# Patient Record
Sex: Female | Born: 1985 | Hispanic: No | Marital: Single | Smoking: Former smoker
Health system: Southern US, Community
[De-identification: ages and names within clinical notes are randomized; demographics above are authoritative.]

## PROBLEM LIST (undated history)

## (undated) DIAGNOSIS — O139 Gestational [pregnancy-induced] hypertension without significant proteinuria, unspecified trimester: Secondary | ICD-10-CM

## (undated) DIAGNOSIS — F32A Depression, unspecified: Secondary | ICD-10-CM

## (undated) DIAGNOSIS — F419 Anxiety disorder, unspecified: Secondary | ICD-10-CM

## (undated) DIAGNOSIS — E282 Polycystic ovarian syndrome: Secondary | ICD-10-CM

## (undated) HISTORY — DX: Polycystic ovarian syndrome: E28.2

## (undated) HISTORY — PX: TONSILLECTOMY AND ADENOIDECTOMY: SHX28

## (undated) HISTORY — PX: TONSILLECTOMY: SUR1361

## (undated) HISTORY — PX: DILATION AND CURETTAGE OF UTERUS: SHX78

## (undated) HISTORY — DX: Anxiety disorder, unspecified: F41.9

## (undated) HISTORY — DX: Depression, unspecified: F32.A

## (undated) HISTORY — DX: Gestational (pregnancy-induced) hypertension without significant proteinuria, unspecified trimester: O13.9

---

## 2019-04-12 LAB — OB RESULTS CONSOLE RPR: RPR: NONREACTIVE

## 2019-12-03 LAB — OB RESULTS CONSOLE HIV ANTIBODY (ROUTINE TESTING): HIV: NONREACTIVE

## 2019-12-03 LAB — OB RESULTS CONSOLE GC/CHLAMYDIA
Chlamydia: NEGATIVE
Gonorrhea: NEGATIVE

## 2019-12-03 LAB — OB RESULTS CONSOLE RUBELLA ANTIBODY, IGM: Rubella: IMMUNE

## 2019-12-03 LAB — OB RESULTS CONSOLE HEPATITIS B SURFACE ANTIGEN: Hepatitis B Surface Ag: NEGATIVE

## 2020-01-06 ENCOUNTER — Other Ambulatory Visit: Payer: Self-pay | Admitting: Obstetrics & Gynecology

## 2020-01-06 DIAGNOSIS — O99212 Obesity complicating pregnancy, second trimester: Secondary | ICD-10-CM

## 2020-01-08 LAB — OB RESULTS CONSOLE GC/CHLAMYDIA
Chlamydia: NEGATIVE
Gonorrhea: NEGATIVE

## 2020-01-08 LAB — OB RESULTS CONSOLE RPR: RPR: NONREACTIVE

## 2020-01-08 LAB — OB RESULTS CONSOLE HEPATITIS B SURFACE ANTIGEN: Hepatitis B Surface Ag: NEGATIVE

## 2020-01-08 LAB — OB RESULTS CONSOLE HIV ANTIBODY (ROUTINE TESTING): HIV: NONREACTIVE

## 2020-01-08 LAB — OB RESULTS CONSOLE RUBELLA ANTIBODY, IGM: Rubella: IMMUNE

## 2020-01-12 LAB — HEPATITIS C ANTIBODY
HCV Ab: NEGATIVE
HCV Ab: NEGATIVE

## 2020-02-09 ENCOUNTER — Ambulatory Visit: Payer: Self-pay

## 2020-02-11 ENCOUNTER — Other Ambulatory Visit: Payer: Self-pay

## 2020-02-11 ENCOUNTER — Encounter: Payer: Self-pay | Admitting: *Deleted

## 2020-02-11 ENCOUNTER — Ambulatory Visit (HOSPITAL_BASED_OUTPATIENT_CLINIC_OR_DEPARTMENT_OTHER): Payer: Medicaid Other

## 2020-02-11 ENCOUNTER — Ambulatory Visit: Payer: Medicaid Other | Attending: Obstetrics & Gynecology | Admitting: *Deleted

## 2020-02-11 ENCOUNTER — Other Ambulatory Visit: Payer: Self-pay | Admitting: *Deleted

## 2020-02-11 VITALS — BP 129/75 | HR 91 | Ht 64.5 in

## 2020-02-11 DIAGNOSIS — Z3A20 20 weeks gestation of pregnancy: Secondary | ICD-10-CM | POA: Insufficient documentation

## 2020-02-11 DIAGNOSIS — Z6841 Body Mass Index (BMI) 40.0 and over, adult: Secondary | ICD-10-CM

## 2020-02-11 DIAGNOSIS — Z363 Encounter for antenatal screening for malformations: Secondary | ICD-10-CM | POA: Diagnosis not present

## 2020-02-11 DIAGNOSIS — E669 Obesity, unspecified: Secondary | ICD-10-CM | POA: Diagnosis not present

## 2020-02-11 DIAGNOSIS — O99212 Obesity complicating pregnancy, second trimester: Secondary | ICD-10-CM

## 2020-03-24 ENCOUNTER — Ambulatory Visit: Payer: Medicaid Other | Attending: Obstetrics and Gynecology

## 2020-03-24 ENCOUNTER — Ambulatory Visit: Payer: Medicaid Other | Admitting: *Deleted

## 2020-03-24 ENCOUNTER — Encounter: Payer: Self-pay | Admitting: *Deleted

## 2020-03-24 ENCOUNTER — Other Ambulatory Visit: Payer: Self-pay | Admitting: *Deleted

## 2020-03-24 ENCOUNTER — Other Ambulatory Visit: Payer: Self-pay

## 2020-03-24 DIAGNOSIS — O99212 Obesity complicating pregnancy, second trimester: Secondary | ICD-10-CM | POA: Insufficient documentation

## 2020-03-24 DIAGNOSIS — Z6841 Body Mass Index (BMI) 40.0 and over, adult: Secondary | ICD-10-CM

## 2020-03-24 DIAGNOSIS — Z362 Encounter for other antenatal screening follow-up: Secondary | ICD-10-CM

## 2020-03-24 DIAGNOSIS — E669 Obesity, unspecified: Secondary | ICD-10-CM | POA: Diagnosis not present

## 2020-03-24 DIAGNOSIS — Z3A26 26 weeks gestation of pregnancy: Secondary | ICD-10-CM | POA: Insufficient documentation

## 2020-04-06 ENCOUNTER — Inpatient Hospital Stay (HOSPITAL_COMMUNITY)
Admission: AD | Admit: 2020-04-06 | Discharge: 2020-04-06 | Disposition: A | Discharge status: Home / Self Care | Payer: Medicaid Other | Attending: Obstetrics and Gynecology | Admitting: Obstetrics and Gynecology

## 2020-04-06 ENCOUNTER — Encounter (HOSPITAL_COMMUNITY): Payer: Self-pay | Admitting: Obstetrics and Gynecology

## 2020-04-06 DIAGNOSIS — F17210 Nicotine dependence, cigarettes, uncomplicated: Secondary | ICD-10-CM | POA: Diagnosis not present

## 2020-04-06 DIAGNOSIS — O163 Unspecified maternal hypertension, third trimester: Secondary | ICD-10-CM | POA: Diagnosis not present

## 2020-04-06 DIAGNOSIS — O99333 Smoking (tobacco) complicating pregnancy, third trimester: Secondary | ICD-10-CM | POA: Insufficient documentation

## 2020-04-06 DIAGNOSIS — Z7982 Long term (current) use of aspirin: Secondary | ICD-10-CM | POA: Insufficient documentation

## 2020-04-06 DIAGNOSIS — O10913 Unspecified pre-existing hypertension complicating pregnancy, third trimester: Secondary | ICD-10-CM | POA: Insufficient documentation

## 2020-04-06 DIAGNOSIS — Z3A28 28 weeks gestation of pregnancy: Secondary | ICD-10-CM | POA: Diagnosis not present

## 2020-04-06 DIAGNOSIS — O368130 Decreased fetal movements, third trimester, not applicable or unspecified: Secondary | ICD-10-CM

## 2020-04-06 LAB — CBC
HCT: 37.8 % (ref 36.0–46.0)
Hemoglobin: 12.2 g/dL (ref 12.0–15.0)
MCH: 26.8 pg (ref 26.0–34.0)
MCHC: 32.3 g/dL (ref 30.0–36.0)
MCV: 83.1 fL (ref 80.0–100.0)
Platelets: 449 10*3/uL — ABNORMAL HIGH (ref 150–400)
RBC: 4.55 MIL/uL (ref 3.87–5.11)
RDW: 14.2 % (ref 11.5–15.5)
WBC: 15.5 10*3/uL — ABNORMAL HIGH (ref 4.0–10.5)
nRBC: 0 % (ref 0.0–0.2)

## 2020-04-06 LAB — PROTEIN / CREATININE RATIO, URINE
Creatinine, Urine: 125.04 mg/dL
Protein Creatinine Ratio: 0.1 mg/mg{Cre} (ref 0.00–0.15)
Total Protein, Urine: 13 mg/dL

## 2020-04-06 LAB — URINALYSIS, ROUTINE W REFLEX MICROSCOPIC
Bilirubin Urine: NEGATIVE
Glucose, UA: NEGATIVE mg/dL
Hgb urine dipstick: NEGATIVE
Ketones, ur: NEGATIVE mg/dL
Leukocytes,Ua: NEGATIVE
Nitrite: NEGATIVE
Protein, ur: NEGATIVE mg/dL
Specific Gravity, Urine: 1.013 (ref 1.005–1.030)
pH: 6 (ref 5.0–8.0)

## 2020-04-06 LAB — COMPREHENSIVE METABOLIC PANEL
ALT: 12 U/L (ref 0–44)
AST: 12 U/L — ABNORMAL LOW (ref 15–41)
Albumin: 2.6 g/dL — ABNORMAL LOW (ref 3.5–5.0)
Alkaline Phosphatase: 62 U/L (ref 38–126)
Anion gap: 6 (ref 5–15)
BUN: 6 mg/dL (ref 6–20)
CO2: 22 mmol/L (ref 22–32)
Calcium: 8.5 mg/dL — ABNORMAL LOW (ref 8.9–10.3)
Chloride: 103 mmol/L (ref 98–111)
Creatinine, Ser: 0.65 mg/dL (ref 0.44–1.00)
GFR, Estimated: >60 mL/min (ref >60)
Glucose, Bld: 113 mg/dL — ABNORMAL HIGH (ref 70–99)
Potassium: 3.5 mmol/L (ref 3.5–5.1)
Sodium: 131 mmol/L — ABNORMAL LOW (ref 135–145)
Total Bilirubin: <0.1 mg/dL — ABNORMAL LOW (ref 0.3–1.2)
Total Protein: 5.9 g/dL — ABNORMAL LOW (ref 6.5–8.1)

## 2020-04-06 NOTE — MAU Provider Note (Signed)
Chief Complaint:  Decreased Fetal Movement   Event Date/Time   First Provider Initiated Contact with Patient 04/06/20 2128     HPI: Reta Norgren is a 35 y.o. G3P0020 at 43w0dwho presents to maternity admissions reporting Decreased fetal movement all day.  We noted her BP to be elevated. Patient states has had some elevated BPs in office and has intermittent visual changes. None today. She reports good fetal movement, denies LOF, vaginal bleeding, vaginal itching/burning, urinary symptoms, h/a, dizziness, n/v, diarrhea, constipation or fever/chills.  She denies headache, visual changes or RUQ abdominal pain.  Other This is a new problem. The current episode started today. The problem occurs constantly. The problem has been resolved. Pertinent negatives include no abdominal pain, chest pain, chills, congestion, fatigue, fever, headaches, myalgias, nausea or visual change. Nothing aggravates the symptoms. She has tried nothing for the symptoms.    RN Note: Pecola Leisure has not been moving like usual today. Have felt some FM for past hour but not like usual. Denies VB. Clear discharge. No pain currently   Past Medical History: Past Medical History:  Diagnosis Date  . Anxiety   . Depression   . PCOS (polycystic ovarian syndrome)     Past obstetric history: OB History  Gravida Para Term Preterm AB Living  3       2    SAB IAB Ectopic Multiple Live Births  1   1        # Outcome Date GA Lbr Len/2nd Weight Sex Delivery Anes PTL Lv  3 Current           2 Ectopic           1 SAB             Past Surgical History: Past Surgical History:  Procedure Laterality Date  . DILATION AND CURETTAGE OF UTERUS    . TONSILLECTOMY      Family History: Family History  Problem Relation Age of Onset  . Hypertension Mother   . Obesity Mother   . Varicose Veins Mother   . Diabetes Father   . Hypertension Father   . Asthma Sister   . Anxiety disorder Sister   . Depression Sister   . Obesity Sister    . Drug abuse Brother   . Cancer Maternal Grandmother   . Diabetes Paternal Grandmother     Social History: Social History   Tobacco Use  . Smoking status: Current Every Day Smoker    Packs/day: 0.25    Types: Cigarettes  . Smokeless tobacco: Never Used  Vaping Use  . Vaping Use: Never used  Substance Use Topics  . Alcohol use: Never  . Drug use: Yes    Types: Cocaine    Allergies: No Known Allergies  Meds:  Medications Prior to Admission  Medication Sig Dispense Refill Last Dose  . aspirin 81 MG chewable tablet Chew by mouth daily.   04/06/2020 at Unknown time  . Prenatal Vit-Fe Fumarate-FA (PRENATAL MULTIVITAMIN) TABS tablet Take 1 tablet by mouth daily at 12 noon.   04/06/2020 at Unknown time    I have reviewed patient's Past Medical Hx, Surgical Hx, Family Hx, Social Hx, medications and allergies.   ROS:  Review of Systems  Constitutional: Negative for chills, fatigue and fever.  HENT: Negative for congestion.   Cardiovascular: Negative for chest pain.  Gastrointestinal: Negative for abdominal pain and nausea.  Musculoskeletal: Negative for myalgias.  Neurological: Negative for headaches.   Other systems negative  Physical  Exam   Patient Vitals for the past 24 hrs:  BP Temp Pulse Resp SpO2 Height Weight  04/06/20 2115 136/85 -- (!) 109 -- 99 % -- --  04/06/20 2110 -- -- -- -- 100 % -- --  04/06/20 2101 (!) 145/86 -- (!) 121 -- -- -- --  04/06/20 2046 (!) 142/95 -- (!) 107 -- -- -- --  04/06/20 2038 -- 98.1 F (36.7 C) -- 18 -- 5' 5.5" (1.664 m) 124.3 kg   Constitutional: Well-developed, well-nourished female in no acute distress.  Cardiovascular: normal rate and rhythm Respiratory: normal effort, clear to auscultation bilaterally GI: Abd soft, non-tender, gravid appropriate for gestational age.   No rebound or guarding. MS: Extremities nontender, no edema, normal ROM Neurologic: Alert and oriented x 4.  GU: Neg CVAT.  PELVIC EXAM: deferred  FHT:   Baseline 145 , moderate variability, small accelerations present, no decelerations Contractions: none   Labs: Results for orders placed or performed during the hospital encounter of 04/06/20 (from the past 24 hour(s))  Urinalysis, Routine w reflex microscopic Urine, Clean Catch     Status: Abnormal   Collection Time: 04/06/20  8:50 PM  Result Value Ref Range   Color, Urine YELLOW YELLOW   APPearance HAZY (A) CLEAR   Specific Gravity, Urine 1.013 1.005 - 1.030   pH 6.0 5.0 - 8.0   Glucose, UA NEGATIVE NEGATIVE mg/dL   Hgb urine dipstick NEGATIVE NEGATIVE   Bilirubin Urine NEGATIVE NEGATIVE   Ketones, ur NEGATIVE NEGATIVE mg/dL   Protein, ur NEGATIVE NEGATIVE mg/dL   Nitrite NEGATIVE NEGATIVE   Leukocytes,Ua NEGATIVE NEGATIVE  Protein / creatinine ratio, urine     Status: None   Collection Time: 04/06/20  8:50 PM  Result Value Ref Range   Creatinine, Urine 125.04 mg/dL   Total Protein, Urine 13 mg/dL   Protein Creatinine Ratio 0.10 0.00 - 0.15 mg/mg[Cre]  CBC     Status: Abnormal   Collection Time: 04/06/20 10:01 PM  Result Value Ref Range   WBC 15.5 (H) 4.0 - 10.5 K/uL   RBC 4.55 3.87 - 5.11 MIL/uL   Hemoglobin 12.2 12.0 - 15.0 g/dL   HCT 58.0 99.8 - 33.8 %   MCV 83.1 80.0 - 100.0 fL   MCH 26.8 26.0 - 34.0 pg   MCHC 32.3 30.0 - 36.0 g/dL   RDW 25.0 53.9 - 76.7 %   Platelets 449 (H) 150 - 400 K/uL   nRBC 0.0 0.0 - 0.2 %  Comprehensive metabolic panel     Status: Abnormal   Collection Time: 04/06/20 10:01 PM  Result Value Ref Range   Sodium 131 (L) 135 - 145 mmol/L   Potassium 3.5 3.5 - 5.1 mmol/L   Chloride 103 98 - 111 mmol/L   CO2 22 22 - 32 mmol/L   Glucose, Bld 113 (H) 70 - 99 mg/dL   BUN 6 6 - 20 mg/dL   Creatinine, Ser 3.41 0.44 - 1.00 mg/dL   Calcium 8.5 (L) 8.9 - 10.3 mg/dL   Total Protein 5.9 (L) 6.5 - 8.1 g/dL   Albumin 2.6 (L) 3.5 - 5.0 g/dL   AST 12 (L) 15 - 41 U/L   ALT 12 0 - 44 U/L   Alkaline Phosphatase 62 38 - 126 U/L   Total Bilirubin <0.1 (L) 0.3  - 1.2 mg/dL   GFR, Estimated >93 >79 mL/min   Anion gap 6 5 - 15       Imaging:    MAU  Course/MDM: I have ordered labs and reviewed results. These are normal  Blood pressures intermittently elevated mildly NST reviewed, reassuring for gestational age Fetus very active.  Audible.  Patient feels some of the movements but not all  Discussed it may be related to position of the baby, diminished sensation in parts of her uterus.    Assessment: Single IUP at [redacted]w[redacted]d Decreased perception of fetal movement, fetus very active here Intermittent hypertension   Plan: Discharge home Has appt Friday, advised to tell them about being here fetal kick counts Follow up in Office for prenatal visits and recheck of BP  Encouraged to return if she develops worsening of symptoms, increase in pain, fever, or other concerning symptoms.   Pt stable at time of discharge.  Wynelle Bourgeois CNM, MSN Certified Nurse-Midwife 04/06/2020 9:28 PM

## 2020-04-06 NOTE — Discharge Instructions (Signed)
Hypertension During Pregnancy Hypertension is also called high blood pressure. High blood pressure means that the force of the blood moving in your body is high enough to cause problems for you and your baby. Different types of high blood pressure can happen during pregnancy. The types are:  High blood pressure before you got pregnant. This is called chronic hypertension.  This can continue during your pregnancy. Your doctor will want to keep checking your blood pressure. You may need medicine to control your blood pressure while you are pregnant. You will need follow-up visits after you have your baby.  High blood pressure that goes up during pregnancy when it was normal before. This is called gestational hypertension. It will often get better after you have your baby, but your doctor will need to watch your blood pressure to make sure that it is getting better.  You may develop high blood pressure after giving birth. This is called postpartum hypertension. This often occurs within 48 hours after childbirth but may occur up to 6 weeks after giving birth. Very high blood pressure during pregnancy is an emergency that needs treatment right away. How does this affect me? If you have high blood pressure during pregnancy, you have a higher chance of developing high blood pressure:  As you get older.  If you get pregnant again. In some cases, high blood pressure during pregnancy can cause:  Stroke.  Heart attack.  Damage to the kidneys, lungs, or liver.  Preeclampsia.  HELLP syndrome.  Seizures.  Problems with the placenta. How does this affect my baby? Your baby may:  Be born early.  Not weigh as much as he or she should.  Not handle labor well, leading to a C-section. This condition may also result in a baby's death before birth (stillbirth). What are the risks?  Having high blood pressure during a past pregnancy.  Being overweight.  Being age 35 or older.  Being pregnant  for the first time.  Being pregnant with more than one baby.  Becoming pregnant using fertility methods, such as IVF.  Having other problems, such as diabetes or kidney disease. What can I do to lower my risk?  Keep a healthy weight.  Eat a healthy diet.  Follow what your doctor tells you about treating any medical problems that you had before you got pregnant. It is very important to go to all of your doctor visits. Your doctor will check your blood pressure and make sure that your pregnancy is progressing as it should. Treatment should start early if a problem is found.   How is this treated? Treatment for high blood pressure during pregnancy can vary. It depends on the type of high blood pressure you have and how serious it is.  If you were taking medicine for your blood pressure before you got pregnant, talk with your doctor. You may need to change the medicine during pregnancy if it is not safe for your baby.  If your blood pressure goes up during pregnancy, your doctor may order medicine to treat this.  If you are at risk for preeclampsia, your doctor may tell you to take a low-dose aspirin while you are pregnant.  If you have very high blood pressure, you may need to stay in the hospital so you and your baby can be watched closely. You may also need to take medicine to lower your blood pressure.  In some cases, if your condition gets worse, you may need to have your baby early.   Follow these instructions at home: Eating and drinking  Drink enough fluid to keep your pee (urine) pale yellow.  Avoid caffeine.   Lifestyle  Do not smoke or use any products that contain nicotine or tobacco. If you need help quitting, ask your doctor.  Do not use alcohol or drugs.  Avoid stress.  Rest and get plenty of sleep.  Regular exercise can help. Ask your doctor what kinds of exercise are best for you. General instructions  Take over-the-counter and prescription medicines only as  told by your doctor.  Keep all prenatal and follow-up visits. Contact a doctor if:  You have symptoms that your doctor told you to watch for, such as: ? Headaches. ? A feeling like you may vomit (nausea). ? Vomiting. ? Belly (abdominal) pain. ? Feeling dizzy or light-headed. Get help right away if:  You have symptoms of serious problems, such as: ? Very bad belly pain that does not get better with treatment. ? A very bad headache that does not get better. ? Blurry vision. ? Double vision. ? Vomiting that does not get better. ? Sudden, fast weight gain. ? Sudden swelling in your hands, ankles, or face. ? Bleeding from your vagina. ? Blood in your pee. ? Shortness of breath. ? Chest pain. ? Weakness on one side of your body. ? Trouble talking.  Your baby is not moving as much as usual. These symptoms may be an emergency. Get help right away. Call your local emergency services (911 in the [place]  Do not wait to see if the symptoms will go away.  Do not drive yourself to the hospital. Summary  High blood pressure is also called hypertension.  High blood pressure means that the force of the blood moving in your body is high enough to cause problems for you and your baby.  Get help right away if you have symptoms of serious problems due to high blood pressure.  Keep all prenatal and follow-up visits. This information is not intended to replace advice given to you by your health care provider. Make sure you discuss any questions you have with your health care provider. Document Revised: 10/08/2019 Document Reviewed: 10/08/2019 Elsevier Patient Education  2021 [employer].  Third Trimester of Pregnancy  The third trimester of pregnancy is from week 28 through week 40. This is also called months 7 through 9. This trimester is when your unborn baby (fetus) is growing very fast. At the end of the ninth month, the unborn baby is about 20 inches long. It weighs about 6-10  pounds. Body changes during your third trimester Your body continues to go through many changes during this time. The changes vary and generally return to normal after the baby is born. Physical changes  Your weight will continue to increase. You may gain 25-35 pounds (11-16 kg) by the end of the pregnancy. If you are underweight, you may gain 28-40 lb (about 13-18 kg). If you are overweight, you may gain 15-25 lb (about 7-11 kg).  You may start to get stretch marks on your hips, belly (abdomen), and breasts.  Your breasts will continue to grow and may hurt. A yellow fluid (colostrum) may leak from your breasts. This is the first milk you are making for your baby.  You may have changes in your hair.  Your belly button may stick out.  You may have more swelling in your hands, face, or ankles. Health changes  You may have heartburn.  You may have trouble  pooping (constipation).  You may get hemorrhoids. These are swollen veins in the butt that can itch or get painful.  You may have swollen veins (varicose veins) in your legs.  You may have more body aches in the pelvis, back, or thighs.  You may have more tingling or numbness in your hands, arms, and legs. The skin on your belly may also feel numb.  You may feel short of breath as your womb (uterus) gets bigger. Other changes  You may pee (urinate) more often.  You may have more problems sleeping.  You may notice the unborn baby "dropping," or moving lower in your belly.  You may have more discharge coming from your vagina.  Your joints may feel loose, and you may have pain around your pelvic bone. Follow these instructions at home: Medicines  Take over-the-counter and prescription medicines only as told by your doctor. Some medicines are not safe during pregnancy.  Take a prenatal vitamin that contains at least 600 micrograms (mcg) of folic acid. Eating and drinking  Eat healthy meals that include: ? Fresh fruits and  vegetables. ? Whole grains. ? Good sources of protein, such as meat, eggs, or tofu. ? Low-fat dairy products.  Avoid raw meat and unpasteurized juice, milk, and cheese. These carry germs that can harm you and your baby.  Eat 4 or 5 small meals rather than 3 large meals a day.  You may need to take these actions to prevent or treat trouble pooping: ? Drink enough fluids to keep your pee (urine) pale yellow. ? Eat foods that are high in fiber. These include beans, whole grains, and fresh fruits and vegetables. ? Limit foods that are high in fat and sugar. These include fried or sweet foods. Activity  Exercise only as told by your doctor. Stop exercising if you start to have cramps in your womb.  Avoid heavy lifting.  Do not exercise if it is too hot or too humid, or if you are in a place of great height (high altitude).  If you choose to, you may have sex unless your doctor tells you not to. Relieving pain and discomfort  Take breaks often, and rest with your legs raised (elevated) if you have leg cramps or low back pain.  Take warm water baths (sitz baths) to soothe pain or discomfort caused by hemorrhoids. Use hemorrhoid cream if your doctor approves.  Wear a good support bra if your breasts are tender.  If you develop bulging, swollen veins in your legs: ? Wear support hose as told by your doctor. ? Raise your feet for 15 minutes, 3-4 times a day. ? Limit salt in your food. Safety  Talk to your doctor before traveling far distances.  Do not use hot tubs, steam rooms, or saunas.  Wear your seat belt at all times when you are in a car.  Talk with your doctor if someone is hurting you or yelling at you a lot. Preparing for your baby's arrival To prepare for the arrival of your baby:  Take prenatal classes.  Visit the hospital and tour the maternity area.  Buy a rear-facing car seat. Learn how to install it in your car.  Prepare the baby's room. Take out all pillows  and stuffed animals from the baby's crib. General instructions  Avoid cat litter boxes and soil used by cats. These carry germs that can cause harm to the baby and can cause a loss of your baby by miscarriage or stillbirth.  Do not douche or use tampons. Do not use scented sanitary pads.  Do not smoke or use any products that contain nicotine or tobacco. If you need help quitting, ask your doctor.  Do not drink alcohol.  Do not use herbal medicines, illegal drugs, or medicines that were not approved by your doctor. Chemicals in these products can affect your baby.  Keep all follow-up visits. This is important. Where to find more information  American Pregnancy Association: americanpregnancy.org  [employer]: www.acog.org  Office on Women's Health: [employer]co.nz Contact a doctor if:  You have a fever.  You have mild cramps or pressure in your lower belly.  You have a nagging pain in your belly area.  You vomit, or you have watery poop (diarrhea).  You have bad-smelling fluid coming from your vagina.  You have pain when you pee, or your pee smells bad.  You have a headache that does not go away when you take medicine.  You have changes in how you see, or you see spots in front of your eyes. Get help right away if:  Your water breaks.  You have regular contractions that are less than 5 minutes apart.  You are spotting or bleeding from your vagina.  You have very bad belly cramps or pain.  You have trouble breathing.  You have chest pain.  You faint.  You have not felt the baby move for the amount of time told by your doctor.  You have new or increased pain, swelling, or redness in an arm or leg. Summary  The third trimester is from week 28 through week 40 (months 7 through 9). This is the time when your unborn baby is growing very fast.  During this time, your discomfort may increase as you gain weight and  as your baby grows.  Get ready for your baby to arrive by taking prenatal classes, buying a rear-facing car seat, and preparing the baby's room.  Get help right away if you are bleeding from your vagina, you have chest pain and trouble breathing, or you have not felt the baby move for the amount of time told by your doctor. This information is not intended to replace advice given to you by your health care provider. Make sure you discuss any questions you have with your health care provider. Document Revised: 06/24/2019 Document Reviewed: 04/30/2019 Elsevier Patient Education  2021 [employer].

## 2020-04-06 NOTE — MAU Note (Signed)
Baby has not been moving like usual today. Have felt some FM for past hour but not like usual. Denies VB. Clear discharge. No pain currently

## 2020-04-21 ENCOUNTER — Ambulatory Visit: Payer: Medicaid Other | Attending: Obstetrics and Gynecology

## 2020-04-21 ENCOUNTER — Other Ambulatory Visit: Payer: Self-pay

## 2020-04-21 ENCOUNTER — Encounter: Payer: Self-pay | Admitting: *Deleted

## 2020-04-21 ENCOUNTER — Ambulatory Visit: Payer: Medicaid Other | Admitting: *Deleted

## 2020-04-21 DIAGNOSIS — Z3A30 30 weeks gestation of pregnancy: Secondary | ICD-10-CM | POA: Insufficient documentation

## 2020-04-21 DIAGNOSIS — O99333 Smoking (tobacco) complicating pregnancy, third trimester: Secondary | ICD-10-CM

## 2020-04-21 DIAGNOSIS — Z362 Encounter for other antenatal screening follow-up: Secondary | ICD-10-CM | POA: Diagnosis not present

## 2020-04-21 DIAGNOSIS — Z6841 Body Mass Index (BMI) 40.0 and over, adult: Secondary | ICD-10-CM

## 2020-04-21 DIAGNOSIS — O99213 Obesity complicating pregnancy, third trimester: Secondary | ICD-10-CM | POA: Insufficient documentation

## 2020-04-21 DIAGNOSIS — F17210 Nicotine dependence, cigarettes, uncomplicated: Secondary | ICD-10-CM

## 2020-04-22 ENCOUNTER — Other Ambulatory Visit: Payer: Self-pay | Admitting: *Deleted

## 2020-04-22 DIAGNOSIS — Z6841 Body Mass Index (BMI) 40.0 and over, adult: Secondary | ICD-10-CM

## 2020-06-02 ENCOUNTER — Encounter (HOSPITAL_COMMUNITY): Payer: Self-pay | Admitting: Obstetrics and Gynecology

## 2020-06-02 ENCOUNTER — Inpatient Hospital Stay (HOSPITAL_COMMUNITY)
Admission: AD | Admit: 2020-06-02 | Discharge: 2020-06-02 | Disposition: A | Discharge status: Home / Self Care | Payer: Medicaid Other | Attending: Obstetrics and Gynecology | Admitting: Obstetrics and Gynecology

## 2020-06-02 ENCOUNTER — Other Ambulatory Visit: Payer: Self-pay

## 2020-06-02 ENCOUNTER — Ambulatory Visit: Payer: Medicaid Other | Attending: Obstetrics and Gynecology

## 2020-06-02 ENCOUNTER — Encounter: Payer: Self-pay | Admitting: *Deleted

## 2020-06-02 ENCOUNTER — Ambulatory Visit: Payer: Medicaid Other | Admitting: *Deleted

## 2020-06-02 VITALS — BP 145/75 | HR 129

## 2020-06-02 DIAGNOSIS — F17210 Nicotine dependence, cigarettes, uncomplicated: Secondary | ICD-10-CM

## 2020-06-02 DIAGNOSIS — Z362 Encounter for other antenatal screening follow-up: Secondary | ICD-10-CM | POA: Diagnosis not present

## 2020-06-02 DIAGNOSIS — O133 Gestational [pregnancy-induced] hypertension without significant proteinuria, third trimester: Secondary | ICD-10-CM | POA: Insufficient documentation

## 2020-06-02 DIAGNOSIS — O99333 Smoking (tobacco) complicating pregnancy, third trimester: Secondary | ICD-10-CM | POA: Diagnosis not present

## 2020-06-02 DIAGNOSIS — Z87891 Personal history of nicotine dependence: Secondary | ICD-10-CM | POA: Diagnosis not present

## 2020-06-02 DIAGNOSIS — Z6841 Body Mass Index (BMI) 40.0 and over, adult: Secondary | ICD-10-CM

## 2020-06-02 DIAGNOSIS — Z3A36 36 weeks gestation of pregnancy: Secondary | ICD-10-CM | POA: Diagnosis not present

## 2020-06-02 DIAGNOSIS — O26893 Other specified pregnancy related conditions, third trimester: Secondary | ICD-10-CM | POA: Insufficient documentation

## 2020-06-02 DIAGNOSIS — R0602 Shortness of breath: Secondary | ICD-10-CM | POA: Diagnosis not present

## 2020-06-02 DIAGNOSIS — R030 Elevated blood-pressure reading, without diagnosis of hypertension: Secondary | ICD-10-CM | POA: Diagnosis present

## 2020-06-02 DIAGNOSIS — O99213 Obesity complicating pregnancy, third trimester: Secondary | ICD-10-CM | POA: Diagnosis not present

## 2020-06-02 LAB — COMPREHENSIVE METABOLIC PANEL WITH GFR
ALT: 14 U/L (ref 0–44)
AST: 15 U/L (ref 15–41)
Albumin: 2.9 g/dL — ABNORMAL LOW (ref 3.5–5.0)
Alkaline Phosphatase: 101 U/L (ref 38–126)
Anion gap: 7 (ref 5–15)
BUN: 7 mg/dL (ref 6–20)
CO2: 22 mmol/L (ref 22–32)
Calcium: 9.2 mg/dL (ref 8.9–10.3)
Chloride: 105 mmol/L (ref 98–111)
Creatinine, Ser: 0.62 mg/dL (ref 0.44–1.00)
GFR, Estimated: >60 mL/min (ref >60)
Glucose, Bld: 97 mg/dL (ref 70–99)
Potassium: 3.8 mmol/L (ref 3.5–5.1)
Sodium: 134 mmol/L — ABNORMAL LOW (ref 135–145)
Total Bilirubin: 0.2 mg/dL — ABNORMAL LOW (ref 0.3–1.2)
Total Protein: 6.5 g/dL (ref 6.5–8.1)

## 2020-06-02 LAB — URINALYSIS, ROUTINE W REFLEX MICROSCOPIC
Bilirubin Urine: NEGATIVE
Glucose, UA: NEGATIVE mg/dL
Ketones, ur: NEGATIVE mg/dL
Leukocytes,Ua: NEGATIVE
Nitrite: NEGATIVE
Protein, ur: 30 mg/dL — AB
RBC / HPF: >50 RBC/hpf — ABNORMAL HIGH (ref 0–5)
Specific Gravity, Urine: 1.026 (ref 1.005–1.030)
pH: 5 (ref 5.0–8.0)

## 2020-06-02 LAB — PROTEIN / CREATININE RATIO, URINE
Creatinine, Urine: 175.77 mg/dL
Protein Creatinine Ratio: 0.11 mg/mg{Cre} (ref 0.00–0.15)
Total Protein, Urine: 20 mg/dL

## 2020-06-02 LAB — CBC
HCT: 39.7 % (ref 36.0–46.0)
Hemoglobin: 12.4 g/dL (ref 12.0–15.0)
MCH: 25.9 pg — ABNORMAL LOW (ref 26.0–34.0)
MCHC: 31.2 g/dL (ref 30.0–36.0)
MCV: 82.9 fL (ref 80.0–100.0)
Platelets: 498 10*3/uL — ABNORMAL HIGH (ref 150–400)
RBC: 4.79 MIL/uL (ref 3.87–5.11)
RDW: 14.4 % (ref 11.5–15.5)
WBC: 11.5 10*3/uL — ABNORMAL HIGH (ref 4.0–10.5)
nRBC: 0 % (ref 0.0–0.2)

## 2020-06-02 MED ORDER — CYCLOBENZAPRINE HCL 5 MG PO TABS
7.5000 mg | ORAL_TABLET | Freq: Once | ORAL | Status: AC
  Administered 2020-06-02: 7.5 mg via ORAL
  Filled 2020-06-02: qty 2

## 2020-06-02 MED ORDER — ACETAMINOPHEN 500 MG PO TABS
1000.0000 mg | ORAL_TABLET | Freq: Once | ORAL | Status: AC
  Administered 2020-06-02: 1000 mg via ORAL
  Filled 2020-06-02: qty 2

## 2020-06-02 NOTE — MAU Note (Signed)
Pt report her b/p elevated in MFM (140's/80's) . C/O head "presure"  And swelling in her hands. Reports she had decreased fetal movement earlier today but feeling baby movement now. Stated she has had SOb for several days.

## 2020-06-02 NOTE — Discharge Instructions (Signed)
Hypertension During Pregnancy Hypertension is also called high blood pressure. High blood pressure means that the force of the blood moving in your body is high enough to cause problems for you and your baby. Different types of high blood pressure can happen during pregnancy. The types are:  High blood pressure before you got pregnant. This is called chronic hypertension.  This can continue during your pregnancy. Your doctor will want to keep checking your blood pressure. You may need medicine to control your blood pressure while you are pregnant. You will need follow-up visits after you have your baby.  High blood pressure that goes up during pregnancy when it was normal before. This is called gestational hypertension. It will often get better after you have your baby, but your doctor will need to watch your blood pressure to make sure that it is getting better.  You may develop high blood pressure after giving birth. This is called postpartum hypertension. This often occurs within 48 hours after childbirth but may occur up to 6 weeks after giving birth. Very high blood pressure during pregnancy is an emergency that needs treatment right away. How does this affect me? If you have high blood pressure during pregnancy, you have a higher chance of developing high blood pressure:  As you get older.  If you get pregnant again. In some cases, high blood pressure during pregnancy can cause:  Stroke.  Heart attack.  Damage to the kidneys, lungs, or liver.  Preeclampsia.  HELLP syndrome.  Seizures.  Problems with the placenta. How does this affect my baby? Your baby may:  Be born early.  Not weigh as much as he or she should.  Not handle labor well, leading to a C-section. This condition may also result in a baby's death before birth (stillbirth). What are the risks?  Having high blood pressure during a past pregnancy.  Being overweight.  Being age 35 or older.  Being pregnant  for the first time.  Being pregnant with more than one baby.  Becoming pregnant using fertility methods, such as IVF.  Having other problems, such as diabetes or kidney disease. What can I do to lower my risk?  Keep a healthy weight.  Eat a healthy diet.  Follow what your doctor tells you about treating any medical problems that you had before you got pregnant. It is very important to go to all of your doctor visits. Your doctor will check your blood pressure and make sure that your pregnancy is progressing as it should. Treatment should start early if a problem is found.   How is this treated? Treatment for high blood pressure during pregnancy can vary. It depends on the type of high blood pressure you have and how serious it is.  If you were taking medicine for your blood pressure before you got pregnant, talk with your doctor. You may need to change the medicine during pregnancy if it is not safe for your baby.  If your blood pressure goes up during pregnancy, your doctor may order medicine to treat this.  If you are at risk for preeclampsia, your doctor may tell you to take a low-dose aspirin while you are pregnant.  If you have very high blood pressure, you may need to stay in the hospital so you and your baby can be watched closely. You may also need to take medicine to lower your blood pressure.  In some cases, if your condition gets worse, you may need to have your baby early.   Follow these instructions at home: Eating and drinking  Drink enough fluid to keep your pee (urine) pale yellow.  Avoid caffeine.   Lifestyle  Do not smoke or use any products that contain nicotine or tobacco. If you need help quitting, ask your doctor.  Do not use alcohol or drugs.  Avoid stress.  Rest and get plenty of sleep.  Regular exercise can help. Ask your doctor what kinds of exercise are best for you. General instructions  Take over-the-counter and prescription medicines only as  told by your doctor.  Keep all prenatal and follow-up visits. Contact a doctor if:  You have symptoms that your doctor told you to watch for, such as: ? Headaches. ? A feeling like you may vomit (nausea). ? Vomiting. ? Belly (abdominal) pain. ? Feeling dizzy or light-headed. Get help right away if:  You have symptoms of serious problems, such as: ? Very bad belly pain that does not get better with treatment. ? A very bad headache that does not get better. ? Blurry vision. ? Double vision. ? Vomiting that does not get better. ? Sudden, fast weight gain. ? Sudden swelling in your hands, ankles, or face. ? Bleeding from your vagina. ? Blood in your pee. ? Shortness of breath. ? Chest pain. ? Weakness on one side of your body. ? Trouble talking.  Your baby is not moving as much as usual. These symptoms may be an emergency. Get help right away. Call your local emergency services (911 in the [place]  Do not wait to see if the symptoms will go away.  Do not drive yourself to the hospital. Summary  High blood pressure is also called hypertension.  High blood pressure means that the force of the blood moving in your body is high enough to cause problems for you and your baby.  Get help right away if you have symptoms of serious problems due to high blood pressure.  Keep all prenatal and follow-up visits. This information is not intended to replace advice given to you by your health care provider. Make sure you discuss any questions you have with your health care provider. Document Revised: 10/08/2019 Document Reviewed: 10/08/2019 [employer] Patient Education  2021 [employer]  

## 2020-06-02 NOTE — MAU Provider Note (Signed)
History     [id number]  Arrival date and time: 06/02/20 1412    Chief Complaint  Patient presents with  . Hypertension  . Headache     HPI Kimberly Kaiser is a 35 y.o. at [redacted]w[redacted]d by patient report with PMHx notable for recently elevated BP's, who presents for shortness of breath, head "pressure", and swelling.   Patient seen earlier in MFM today for growth scan, normal EFW and BPP 8/8, had elevated mild range BP's while there Reports for the past week she has been feeling more short of breath and hot Also reports intermittent swelling of hands and feet Endorses head "pressure" but denies pain, also has intermittent blurry vision Denies chest pain, RUQ pain Denies vaginal bleeding, loss of fluid, contractions Good fetal movement  Review of chart shows patient had elevated BP's around 0719 this morning      OB History    Gravida  3   Para      Term      Preterm      AB  2   Living        SAB  1   IAB      Ectopic  1   Multiple      Live Births              Past Medical History:  Diagnosis Date  . Anxiety   . Depression   . PCOS (polycystic ovarian syndrome)     Past Surgical History:  Procedure Laterality Date  . DILATION AND CURETTAGE OF UTERUS    . TONSILLECTOMY    . TONSILLECTOMY AND ADENOIDECTOMY     at age of 66     Family History  Problem Relation Age of Onset  . Hypertension Mother   . Obesity Mother   . Varicose Veins Mother   . Diabetes Father   . Hypertension Father   . Asthma Sister   . Anxiety disorder Sister   . Depression Sister   . Obesity Sister   . Drug abuse Brother   . Cancer Maternal Grandmother   . Diabetes Paternal Grandmother     Social History   Socioeconomic History  . Marital status: Single    Spouse name: Not on file  . Number of children: Not on file  . Years of education: Not on file  . Highest education level: Not on file  Occupational History  . Not on file  Tobacco Use  . Smoking status:  Former Smoker    Packs/day: 0.25    Types: Cigarettes  . Smokeless tobacco: Never Used  Vaping Use  . Vaping Use: Never used  Substance and Sexual Activity  . Alcohol use: Not Currently  . Drug use: Not Currently  . Sexual activity: Not Currently  Other Topics Concern  . Not on file  Social History Narrative  . Not on file   Social Determinants of Health   Financial Resource Strain: Not on file  Food Insecurity: Not on file  Transportation Needs: Not on file  Physical Activity: Not on file  Stress: Not on file  Social Connections: Not on file  Intimate Partner Violence: Not on file    No Known Allergies  No current facility-administered medications on file prior to encounter.   Current Outpatient Medications on File Prior to Encounter  Medication Sig Dispense Refill  . aspirin 81 MG chewable tablet Chew by mouth daily.    . Prenatal Vit-Fe Fumarate-FA (PRENATAL MULTIVITAMIN) TABS tablet Take 1 tablet  by mouth daily at 12 noon.       ROS Pertinent positives and negative per HPI, all others reviewed and negative  Physical Exam   BP 109/74   Pulse 99   Temp 98 F (36.7 C)   Resp 18   Ht 5' 5.5" (1.664 m)   Wt 133.4 kg   LMP 09/19/2019   BMI 48.18 kg/m   Patient Vitals for the past 24 hrs:  BP Temp Pulse Resp Height Weight  06/02/20 1646 109/74 -- 99 -- -- --  06/02/20 1631 111/63 -- (!) 102 -- -- --  06/02/20 1616 (!) 136/94 -- 99 -- -- --  06/02/20 1546 (!) 141/101 -- (!) 101 -- -- --  06/02/20 1531 (!) 142/86 -- 100 -- -- --  06/02/20 1516 140/85 -- (!) 103 -- -- --  06/02/20 1502 139/87 -- (!) 123 -- -- --  06/02/20 1441 (!) 148/87 -- (!) 120 -- -- --  06/02/20 1420 (!) 157/85 98 F (36.7 C) (!) 114 18 5' 5.5" (1.664 m) 133.4 kg    Physical Exam Vitals reviewed.  Constitutional:      General: She is not in acute distress.    Appearance: She is well-developed. She is not diaphoretic.  Eyes:     General: No scleral icterus. Cardiovascular:      Rate and Rhythm: Regular rhythm. Tachycardia present.     Heart sounds: Normal heart sounds. No murmur heard. No friction rub. No gallop.   Pulmonary:     Effort: Pulmonary effort is normal. No respiratory distress.     Breath sounds: Normal breath sounds. No wheezing or rales.  Abdominal:     General: There is no distension.     Palpations: Abdomen is soft.     Tenderness: There is no abdominal tenderness. There is no guarding or rebound.  Skin:    General: Skin is warm and dry.  Neurological:     Mental Status: She is alert.     Coordination: Coordination normal.      Cervical Exam    Bedside Ultrasound Not done  My interpretation: n/a  FHT Baseline 140, moderate variability, +accels, no decels Toco: quiet Cat: I  Labs Results for orders placed or performed during the hospital encounter of 06/02/20 (from the past 24 hour(s))  Urinalysis, Routine w reflex microscopic     Status: Abnormal   Collection Time: 06/02/20  2:25 PM  Result Value Ref Range   Color, Urine YELLOW YELLOW   APPearance CLOUDY (A) CLEAR   Specific Gravity, Urine 1.026 1.005 - 1.030   pH 5.0 5.0 - 8.0   Glucose, UA NEGATIVE NEGATIVE mg/dL   Hgb urine dipstick LARGE (A) NEGATIVE   Bilirubin Urine NEGATIVE NEGATIVE   Ketones, ur NEGATIVE NEGATIVE mg/dL   Protein, ur 30 (A) NEGATIVE mg/dL   Nitrite NEGATIVE NEGATIVE   Leukocytes,Ua NEGATIVE NEGATIVE   RBC / HPF >50 (H) 0 - 5 RBC/hpf   WBC, UA 0-5 0 - 5 WBC/hpf   Bacteria, UA FEW (A) NONE SEEN   Squamous Epithelial / LPF 21-50 0 - 5   Mucus PRESENT    Ca Oxalate Crys, UA PRESENT   Protein / creatinine ratio, urine     Status: None   Collection Time: 06/02/20  2:25 PM  Result Value Ref Range   Creatinine, Urine 175.77 mg/dL   Total Protein, Urine 20 mg/dL   Protein Creatinine Ratio 0.11 0.00 - 0.15 mg/mg[Cre]  CBC  Status: Abnormal   Collection Time: 06/02/20  3:19 PM  Result Value Ref Range   WBC 11.5 (H) 4.0 - 10.5 K/uL   RBC 4.79 3.87  - 5.11 MIL/uL   Hemoglobin 12.4 12.0 - 15.0 g/dL   HCT 09.7 35.3 - 29.9 %   MCV 82.9 80.0 - 100.0 fL   MCH 25.9 (L) 26.0 - 34.0 pg   MCHC 31.2 30.0 - 36.0 g/dL   RDW 24.2 68.3 - 41.9 %   Platelets 498 (H) 150 - 400 K/uL   nRBC 0.0 0.0 - 0.2 %  Comprehensive metabolic panel     Status: Abnormal   Collection Time: 06/02/20  3:19 PM  Result Value Ref Range   Sodium 134 (L) 135 - 145 mmol/L   Potassium 3.8 3.5 - 5.1 mmol/L   Chloride 105 98 - 111 mmol/L   CO2 22 22 - 32 mmol/L   Glucose, Bld 97 70 - 99 mg/dL   BUN 7 6 - 20 mg/dL   Creatinine, Ser 6.22 0.44 - 1.00 mg/dL   Calcium 9.2 8.9 - 29.7 mg/dL   Total Protein 6.5 6.5 - 8.1 g/dL   Albumin 2.9 (L) 3.5 - 5.0 g/dL   AST 15 15 - 41 U/L   ALT 14 0 - 44 U/L   Alkaline Phosphatase 101 38 - 126 U/L   Total Bilirubin 0.2 (L) 0.3 - 1.2 mg/dL   GFR, Estimated >98 >92 mL/min   Anion gap 7 5 - 15    Imaging [place] MFM FETAL BPP WO NON STRESS  Result Date: 06/02/2020 ----------------------------------------------------------------------  OBSTETRICS REPORT                       (Signed Final 06/02/2020 08:25 am) ---------------------------------------------------------------------- Patient Info  ID #:       [id number]                          D.O.B.:  [birth year] (35 yrs)  Name:       Kimberly Kaiser                   Visit Date: 06/02/2020 07:51 am ---------------------------------------------------------------------- Performed By  Attending:        Noralee Space MD        Ref. Address:     [facility]                                                             OBGYN                                                             [address] [phone]  [place]  Performed By:     Clayton Lefort RDMS       Location:         [facility]  Referred By:      Rande Brunt ---------------------------------------------------------------------- Orders  #  Description                           Code        Ordered By  1  [place] MFM FETAL BPP WO NON               76819.01    RAVI SHANKAR     STRESS  2  [place] MFM OB FOLLOW UP                   76816.01    RAVI [facility] ----------------------------------------------------------------------  #  Order #                     Accession #                Episode #  1  [id number]                   [id number]                 [id number]  2  [id number]                   [id number]                 [id number] ---------------------------------------------------------------------- Indications  [redacted] weeks gestation of pregnancy                Z3A.36  Obesity complicating pregnancy, second         O99.212  trimester (pregravid BMI 41)  Encounter for other antenatal screening        Z36.2  follow-up  Tobacco use complicating pregnancy, third      O99.333  trimester ---------------------------------------------------------------------- Fetal Evaluation  Num Of Fetuses:         1  Fetal Heart Rate(bpm):  145  Cardiac Activity:       Observed  Presentation:           Cephalic  Placenta:               Anterior  P. Cord Insertion:      Visualized, central  Amniotic Fluid  AFI FV:      Within normal limits  AFI Sum(cm)     %Tile       Largest Pocket(cm)  22.09           84          7.82  RUQ(cm)       RLQ(cm)       LUQ(cm)        LLQ(cm)  4.01          3.32          6.94           7.82 ---------------------------------------------------------------------- Biophysical Evaluation  Amniotic F.V:   Within normal limits       F. Tone:        Observed  F. Movement:    Observed                   Score:          8/8  F. Breathing:   Observed ----------------------------------------------------------------------  Biometry  BPD:      93.6  mm     G. Age:  38w 1d         95  %    CI:           76   %    70 - 86                                                          FL/HC:      20.5   %    20.1 - 22.1  HC:      340.3  mm     G. Age:  39w 1d         88  %    HC/AC:      1.02        0.93 - 1.11  AC:      332.3  mm     G. Age:  37w 1d         85  %    FL/BPD:     74.6   %    71 - 87  FL:       69.8  mm     G. Age:  35w 6d         37  %    FL/AC:      21.0   %    20 - 24  Est. FW:    3128  gm    6 lb 14 oz      78  % ---------------------------------------------------------------------- OB History  Gravidity:    3         Term:   0        Prem:   0        SAB:   1  TOP:          0       Ectopic:  1        Living: 2 ---------------------------------------------------------------------- Gestational Age  LMP:           36w 5d  Date:  09/19/19                 EDD:   06/25/20  U/S Today:     37w 4d                                        EDD:   06/19/20  Best:          36w 1d     Det. ByMarcella Dubs         EDD:   06/29/20                                      (12/03/19) ---------------------------------------------------------------------- Anatomy  Cranium:               Previously seen        Aortic Arch:            Previously seen  Cavum:                 Previously seen        Ductal Arch:            Previously seen  Ventricles:            Appears normal         Diaphragm:              Previously seen  Choroid Plexus:        Previously seen        Stomach:                Appears normal, left                                                                        sided  Cerebellum:            Previously seen        Abdomen:                Previously seen  Posterior Fossa:       Previously seen        Abdominal Wall:         Previously seen  Nuchal Fold:           Previously seen        Cord Vessels:           Previously seen  Face:                  Appears normal         Kidneys:                Appear normal                          (orbits and profile)  Lips:                  Previously seen        Bladder:  Appears normal  Thoracic:              Previously seen        Spine:                  Previously seen  Heart:                 Appears normal         Upper Extremities:      Previously seen                         (4CH, axis, and                         situs)  RVOT:                  Previously seen        Lower Extremities:      Previously seen  LVOT:                  Previously seen  Other:  VC, 3VV and 3VTV previously visualized. Female gender previously          seen. ---------------------------------------------------------------------- Cervix Uterus Adnexa  Cervix  Not visualized (advanced GA >24wks)  Right Ovary  Not visualized.  Left Ovary  Not visualized. ---------------------------------------------------------------------- Impression  Patient returned for fetal growth assessment and antenatal  testing. Her blood pressures were 145/89- and 145/75-mm  Hg. She has intermittent headaches that resolved now.  Patient reports her blood pressures have been normal at  prenatal visits except once at 28 weeks' gestation.  Fetal growth is appropriate for gestational age. Amniotic fluid  is normal and good fetal activity is seen. Antenatal testing is  reassuring. BPP 8/8. Cephalic presentation.  I reassured the patient of the findings. I discussed the  possibility of gestational hypertension/preeclampsia.  If this is a new onset hypertension in pregnancy, I  recommend delivery at 37 weeks' gestation. However, I will  defer the decision to her obstetric providers (review of her  prenatal visits). ---------------------------------------------------------------------- Recommendations  -Patient can follow-up at your office for weekly antenatal  testing (if undelivered). ----------------------------------------------------------------------                  Noralee Space, MD Electronically Signed Final Report   06/02/2020 08:25  am ----------------------------------------------------------------------  [place] MFM OB FOLLOW UP  Result Date: 06/02/2020 ----------------------------------------------------------------------  OBSTETRICS REPORT                       (Signed Final 06/02/2020 08:25 am) ---------------------------------------------------------------------- Patient Info  ID #:       [id number]                          D.O.B.:  [birth year] (35 yrs)  Name:       Kimberly Kaiser                   Visit Date: 06/02/2020 07:51 am ---------------------------------------------------------------------- Performed By  Attending:        Noralee Space MD        Ref. Address:     [facility]                                                             [address]                                                             [place]  Performed By:     Clayton Lefort RDMS       Location:         [facility]  Referred By:      Rande Brunt ---------------------------------------------------------------------- Orders  #  Description                           Code        Ordered By  1  [place] MFM FETAL BPP WO NON               76819.01    RAVI SHANKAR     STRESS  2  [place] MFM OB FOLLOW UP                   86578.46    RAVI [facility] ----------------------------------------------------------------------  #  Order #                     Accession #                Episode #  1  [id number]  [id number]                 [id number]  2  [id number]                   [id number]                 [id number] ----------------------------------------------------------------------  Indications  [redacted] weeks gestation of pregnancy                Z3A.36  Obesity complicating pregnancy, second         O99.212  trimester (pregravid BMI 41)  Encounter for other antenatal screening        Z36.2  follow-up  Tobacco use complicating pregnancy, third      O99.333  trimester ---------------------------------------------------------------------- Fetal Evaluation  Num Of Fetuses:         1  Fetal Heart Rate(bpm):  145  Cardiac Activity:       Observed  Presentation:           Cephalic  Placenta:               Anterior  P. Cord Insertion:      Visualized, central  Amniotic Fluid  AFI FV:      Within normal limits  AFI Sum(cm)     %Tile       Largest Pocket(cm)  22.09           84          7.82  RUQ(cm)       RLQ(cm)       LUQ(cm)        LLQ(cm)  4.01          3.32          6.94           7.82 ---------------------------------------------------------------------- Biophysical Evaluation  Amniotic F.V:   Within normal limits       F. Tone:        Observed  F. Movement:    Observed                   Score:          8/8  F. Breathing:   Observed ---------------------------------------------------------------------- Biometry  BPD:      93.6  mm     G. Age:  38w 1d         95  %    CI:           76   %    70 - 86                                                          FL/HC:      20.5   %    20.1 - 22.1  HC:      340.3  mm     G. Age:  39w 1d         88  %    HC/AC:      1.02        0.93 - 1.11  AC:      332.3  mm     G. Age:  37w 1d         85  %    FL/BPD:  74.6   %    71 - 87  FL:       69.8  mm     G. Age:  35w 6d         37  %    FL/AC:      21.0   %    20 - 24  Est. FW:    3128  gm    6 lb 14 oz      78  % ---------------------------------------------------------------------- OB History  Gravidity:    3         Term:   0        Prem:   0        SAB:   1  TOP:          0       Ectopic:  1        Living: 2 ---------------------------------------------------------------------- Gestational Age  LMP:            36w 5d        Date:  09/19/19                 EDD:   06/25/20  U/S Today:     37w 4d                                        EDD:   06/19/20  Best:          36w 1d     Det. ByMarcella Dubs         EDD:   06/29/20                                      (12/03/19) ---------------------------------------------------------------------- Anatomy  Cranium:               Previously seen        Aortic Arch:            Previously seen  Cavum:                 Previously seen        Ductal Arch:            Previously seen  Ventricles:            Appears normal         Diaphragm:              Previously seen  Choroid Plexus:        Previously seen        Stomach:                Appears normal, left                                                                        sided  Cerebellum:            Previously seen        Abdomen:  Previously seen  Posterior Fossa:       Previously seen        Abdominal Wall:         Previously seen  Nuchal Fold:           Previously seen        Cord Vessels:           Previously seen  Face:                  Appears normal         Kidneys:                Appear normal                         (orbits and profile)  Lips:                  Previously seen        Bladder:                Appears normal  Thoracic:              Previously seen        Spine:                  Previously seen  Heart:                 Appears normal         Upper Extremities:      Previously seen                         (4CH, axis, and                         situs)  RVOT:                  Previously seen        Lower Extremities:      Previously seen  LVOT:                  Previously seen  Other:  VC, 3VV and 3VTV previously visualized. Female gender previously          seen. ---------------------------------------------------------------------- Cervix Uterus Adnexa  Cervix  Not visualized (advanced GA >24wks)  Right Ovary  Not visualized.  Left Ovary  Not visualized.  ---------------------------------------------------------------------- Impression  Patient returned for fetal growth assessment and antenatal  testing. Her blood pressures were 145/89- and 145/75-mm  Hg. She has intermittent headaches that resolved now.  Patient reports her blood pressures have been normal at  prenatal visits except once at 28 weeks' gestation.  Fetal growth is appropriate for gestational age. Amniotic fluid  is normal and good fetal activity is seen. Antenatal testing is  reassuring. BPP 8/8. Cephalic presentation.  I reassured the patient of the findings. I discussed the  possibility of gestational hypertension/preeclampsia.  If this is a new onset hypertension in pregnancy, I  recommend delivery at 37 weeks' gestation. However, I will  defer the decision to her obstetric providers (review of her  prenatal visits). ---------------------------------------------------------------------- Recommendations  -Patient can follow-up at your office for weekly antenatal  testing (if undelivered). ----------------------------------------------------------------------                  Noralee Space, MD Electronically Signed Final Report   06/02/2020 08:25 am ----------------------------------------------------------------------  MAU Course  Procedures  Lab Orders     Urinalysis, Routine w reflex microscopic Urine, Clean Catch     Protein / creatinine ratio, urine     CBC     Comprehensive metabolic panel Meds ordered this encounter  Medications  . acetaminophen (TYLENOL) tablet 1,000 mg  . cyclobenzaprine (FLEXERIL) tablet 7.5 mg   Imaging Orders  No imaging studies ordered today    MDM moderate  Assessment and Plan  #gHTN #Head pressure #SOB Patient objectively well appearing on exam despite subjective complaints of SOB. Speaking in full sentences, symptoms appear more c/w advancing gestational age and anxiety. Does however meet criteria at this point for gHTN given elevated BP's  >4h apart today. UPCR normal, CBC and CMP are normal. Headache treated with tylenol and flexeril with good improvement. Discussed indication for IOL at 37 weeks, reviewed PreE warning signs. Dr. Claiborne Billings from [facility] notified by telephone.   #FWB FHT Cat I NST: Reactive  Discharged to home in stable condition.  Venora Maples, MD/MPH 06/02/20 5:10 PM  Allergies as of 06/02/2020   No Known Allergies     Medication List    TAKE these medications   aspirin 81 MG chewable tablet Chew by mouth daily.   prenatal multivitamin Tabs tablet Take 1 tablet by mouth daily at 12 noon.

## 2020-06-03 LAB — OB RESULTS CONSOLE GBS: GBS: POSITIVE

## 2020-06-06 ENCOUNTER — Other Ambulatory Visit (HOSPITAL_COMMUNITY)
Admission: RE | Admit: 2020-06-06 | Discharge: 2020-06-06 | Disposition: A | Discharge status: Home / Self Care | Payer: Medicaid Other | Source: Ambulatory Visit | Attending: Obstetrics and Gynecology | Admitting: Obstetrics and Gynecology

## 2020-06-06 ENCOUNTER — Telehealth (HOSPITAL_COMMUNITY): Payer: Self-pay | Admitting: *Deleted

## 2020-06-06 ENCOUNTER — Encounter (HOSPITAL_COMMUNITY): Payer: Self-pay | Admitting: *Deleted

## 2020-06-06 DIAGNOSIS — Z20822 Contact with and (suspected) exposure to covid-19: Secondary | ICD-10-CM | POA: Insufficient documentation

## 2020-06-06 DIAGNOSIS — Z01812 Encounter for preprocedural laboratory examination: Secondary | ICD-10-CM | POA: Diagnosis present

## 2020-06-06 LAB — OB RESULTS CONSOLE GBS: GBS: NEGATIVE

## 2020-06-06 NOTE — Telephone Encounter (Signed)
Preadmission screen  

## 2020-06-07 ENCOUNTER — Other Ambulatory Visit: Payer: Self-pay

## 2020-06-07 LAB — SARS CORONAVIRUS 2 (TAT 6-24 HRS): SARS Coronavirus 2: NEGATIVE

## 2020-06-08 ENCOUNTER — Inpatient Hospital Stay (HOSPITAL_COMMUNITY): Payer: Medicaid Other

## 2020-06-08 ENCOUNTER — Encounter (HOSPITAL_COMMUNITY)
Admission: AD | Disposition: A | Discharge status: Home / Self Care | Payer: Self-pay | Source: Home / Self Care | Attending: Obstetrics and Gynecology

## 2020-06-08 ENCOUNTER — Inpatient Hospital Stay (HOSPITAL_COMMUNITY)
Admission: AD | Admit: 2020-06-08 | Discharge: 2020-06-10 | DRG: 787 | Disposition: A | Discharge status: Home / Self Care | Payer: Medicaid Other | Attending: Obstetrics and Gynecology | Admitting: Obstetrics and Gynecology

## 2020-06-08 ENCOUNTER — Inpatient Hospital Stay (HOSPITAL_COMMUNITY): Payer: Medicaid Other | Admitting: Anesthesiology

## 2020-06-08 ENCOUNTER — Encounter (HOSPITAL_COMMUNITY): Payer: Self-pay | Admitting: Obstetrics and Gynecology

## 2020-06-08 ENCOUNTER — Other Ambulatory Visit: Payer: Self-pay

## 2020-06-08 DIAGNOSIS — O99214 Obesity complicating childbirth: Secondary | ICD-10-CM | POA: Diagnosis present

## 2020-06-08 DIAGNOSIS — O26893 Other specified pregnancy related conditions, third trimester: Secondary | ICD-10-CM | POA: Diagnosis present

## 2020-06-08 DIAGNOSIS — D62 Acute posthemorrhagic anemia: Secondary | ICD-10-CM | POA: Diagnosis not present

## 2020-06-08 DIAGNOSIS — Z87891 Personal history of nicotine dependence: Secondary | ICD-10-CM | POA: Diagnosis not present

## 2020-06-08 DIAGNOSIS — Z3A37 37 weeks gestation of pregnancy: Secondary | ICD-10-CM | POA: Diagnosis not present

## 2020-06-08 DIAGNOSIS — O99824 Streptococcus B carrier state complicating childbirth: Secondary | ICD-10-CM | POA: Diagnosis present

## 2020-06-08 DIAGNOSIS — O134 Gestational [pregnancy-induced] hypertension without significant proteinuria, complicating childbirth: Secondary | ICD-10-CM | POA: Diagnosis present

## 2020-06-08 DIAGNOSIS — Z6741 Type O blood, Rh negative: Secondary | ICD-10-CM

## 2020-06-08 DIAGNOSIS — O9081 Anemia of the puerperium: Secondary | ICD-10-CM | POA: Diagnosis not present

## 2020-06-08 DIAGNOSIS — Z3490 Encounter for supervision of normal pregnancy, unspecified, unspecified trimester: Secondary | ICD-10-CM

## 2020-06-08 LAB — CBC
HCT: 39.2 % (ref 36.0–46.0)
HCT: 39.8 % (ref 36.0–46.0)
Hemoglobin: 12.5 g/dL (ref 12.0–15.0)
Hemoglobin: 13 g/dL (ref 12.0–15.0)
MCH: 26.2 pg (ref 26.0–34.0)
MCH: 26.7 pg (ref 26.0–34.0)
MCHC: 31.9 g/dL (ref 30.0–36.0)
MCHC: 32.7 g/dL (ref 30.0–36.0)
MCV: 81.9 fL (ref 80.0–100.0)
MCV: 82.2 fL (ref 80.0–100.0)
Platelets: 461 10*3/uL — ABNORMAL HIGH (ref 150–400)
Platelets: 481 10*3/uL — ABNORMAL HIGH (ref 150–400)
RBC: 4.77 MIL/uL (ref 3.87–5.11)
RBC: 4.86 MIL/uL (ref 3.87–5.11)
RDW: 14.4 % (ref 11.5–15.5)
RDW: 14.5 % (ref 11.5–15.5)
WBC: 13.3 10*3/uL — ABNORMAL HIGH (ref 4.0–10.5)
WBC: 14.6 10*3/uL — ABNORMAL HIGH (ref 4.0–10.5)
nRBC: 0 % (ref 0.0–0.2)
nRBC: 0 % (ref 0.0–0.2)

## 2020-06-08 LAB — RPR: RPR Ser Ql: NONREACTIVE

## 2020-06-08 LAB — TYPE AND SCREEN
ABO/RH(D): O NEG
Antibody Screen: NEGATIVE

## 2020-06-08 SURGERY — Surgical Case
Anesthesia: Epidural

## 2020-06-08 MED ORDER — MISOPROSTOL 25 MCG QUARTER TABLET
25.0000 ug | ORAL_TABLET | ORAL | Status: DC | PRN
  Administered 2020-06-08 (×4): 25 ug via VAGINAL
  Filled 2020-06-08 (×4): qty 1

## 2020-06-08 MED ORDER — KETOROLAC TROMETHAMINE 30 MG/ML IJ SOLN: 30.0000 mg | Freq: Four times a day (QID) | INTRAMUSCULAR | Status: AC | PRN

## 2020-06-08 MED ORDER — OXYCODONE-ACETAMINOPHEN 5-325 MG PO TABS: 1.0000 | ORAL_TABLET | ORAL | Status: DC | PRN

## 2020-06-08 MED ORDER — ACETAMINOPHEN 500 MG PO TABS: 1000.0000 mg | ORAL_TABLET | Freq: Four times a day (QID) | ORAL | Status: DC

## 2020-06-08 MED ORDER — CEFAZOLIN IN SODIUM CHLORIDE 3-0.9 GM/100ML-% IV SOLN
3.0000 g | INTRAVENOUS | Status: AC
  Administered 2020-06-08: 3 g via INTRAVENOUS
  Filled 2020-06-08: qty 100

## 2020-06-08 MED ORDER — EPHEDRINE 5 MG/ML INJ: 10.0000 mg | INTRAVENOUS | Status: DC | PRN

## 2020-06-08 MED ORDER — NALBUPHINE HCL 10 MG/ML IJ SOLN: 5.0000 mg | Freq: Once | INTRAMUSCULAR | Status: DC | PRN

## 2020-06-08 MED ORDER — DEXAMETHASONE SODIUM PHOSPHATE 10 MG/ML IJ SOLN
INTRAMUSCULAR | Status: AC
  Filled 2020-06-08: qty 1

## 2020-06-08 MED ORDER — PHENYLEPHRINE 40 MCG/ML (10ML) SYRINGE FOR IV PUSH (FOR BLOOD PRESSURE SUPPORT): 80.0000 ug | PREFILLED_SYRINGE | INTRAVENOUS | Status: DC | PRN

## 2020-06-08 MED ORDER — KETOROLAC TROMETHAMINE 30 MG/ML IJ SOLN
30.0000 mg | Freq: Four times a day (QID) | INTRAMUSCULAR | Status: AC
  Administered 2020-06-09 (×3): 30 mg via INTRAVENOUS
  Filled 2020-06-08 (×3): qty 1

## 2020-06-08 MED ORDER — FENTANYL CITRATE (PF) 100 MCG/2ML IJ SOLN
INTRAMUSCULAR | Status: AC
  Administered 2020-06-08: 100 ug via INTRAVENOUS
  Filled 2020-06-08: qty 2

## 2020-06-08 MED ORDER — ONDANSETRON HCL 4 MG/2ML IJ SOLN
INTRAMUSCULAR | Status: AC
  Filled 2020-06-08: qty 2

## 2020-06-08 MED ORDER — NALOXONE HCL 0.4 MG/ML IJ SOLN: 0.4000 mg | INTRAMUSCULAR | Status: DC | PRN

## 2020-06-08 MED ORDER — SODIUM CHLORIDE 0.9 % IR SOLN
Status: DC | PRN
  Administered 2020-06-08: 1

## 2020-06-08 MED ORDER — FENTANYL-BUPIVACAINE-NACL 0.5-0.125-0.9 MG/250ML-% EP SOLN
EPIDURAL | Status: DC | PRN
  Administered 2020-06-08: 12 mL/h via EPIDURAL

## 2020-06-08 MED ORDER — DIPHENHYDRAMINE HCL 50 MG/ML IJ SOLN
12.5000 mg | Freq: Four times a day (QID) | INTRAMUSCULAR | Status: DC | PRN
  Administered 2020-06-09: 12.5 mg via INTRAVENOUS

## 2020-06-08 MED ORDER — DIPHENHYDRAMINE HCL 25 MG PO CAPS
25.0000 mg | ORAL_CAPSULE | Freq: Once | ORAL | Status: AC
  Administered 2020-06-08: 25 mg via ORAL
  Filled 2020-06-08: qty 1

## 2020-06-08 MED ORDER — NALOXONE HCL 4 MG/10ML IJ SOLN
1.0000 ug/kg/h | INTRAVENOUS | Status: DC | PRN
  Filled 2020-06-08: qty 5

## 2020-06-08 MED ORDER — LIDOCAINE HCL (PF) 1 % IJ SOLN
INTRAMUSCULAR | Status: DC | PRN
  Administered 2020-06-08 (×2): 4 mL via EPIDURAL

## 2020-06-08 MED ORDER — ONDANSETRON HCL 4 MG/2ML IJ SOLN: 4.0000 mg | Freq: Four times a day (QID) | INTRAMUSCULAR | Status: DC | PRN

## 2020-06-08 MED ORDER — LACTATED RINGERS IV SOLN
500.0000 mL | INTRAVENOUS | Status: DC | PRN
Start: 2020-06-08 — End: 2020-06-09

## 2020-06-08 MED ORDER — PENICILLIN G POT IN DEXTROSE 60000 UNIT/ML IV SOLN
3.0000 10*6.[IU] | INTRAVENOUS | Status: DC
  Administered 2020-06-08 (×2): 3 10*6.[IU] via INTRAVENOUS
  Filled 2020-06-08 (×2): qty 50

## 2020-06-08 MED ORDER — LACTATED RINGERS IV SOLN: INTRAVENOUS | Status: DC

## 2020-06-08 MED ORDER — NALBUPHINE HCL 10 MG/ML IJ SOLN
5.0000 mg | INTRAMUSCULAR | Status: DC | PRN
  Administered 2020-06-09 (×2): 5 mg via INTRAVENOUS
  Filled 2020-06-08 (×2): qty 1

## 2020-06-08 MED ORDER — FENTANYL CITRATE (PF) 100 MCG/2ML IJ SOLN: 25.0000 ug | INTRAMUSCULAR | Status: DC | PRN

## 2020-06-08 MED ORDER — OXYTOCIN-SODIUM CHLORIDE 30-0.9 UT/500ML-% IV SOLN
INTRAVENOUS | Status: DC | PRN
  Administered 2020-06-08: 30 [IU] via INTRAVENOUS

## 2020-06-08 MED ORDER — NALBUPHINE HCL 10 MG/ML IJ SOLN
5.0000 mg | INTRAMUSCULAR | Status: DC | PRN
Start: 2020-06-08 — End: 2020-06-10

## 2020-06-08 MED ORDER — ACETAMINOPHEN 160 MG/5ML PO SOLN: 1000.0000 mg | Freq: Once | ORAL | Status: DC

## 2020-06-08 MED ORDER — MORPHINE SULFATE (PF) 0.5 MG/ML IJ SOLN
INTRAMUSCULAR | Status: AC
  Filled 2020-06-08: qty 10

## 2020-06-08 MED ORDER — PHENYLEPHRINE HCL (PRESSORS) 10 MG/ML IV SOLN
INTRAVENOUS | Status: DC | PRN
  Administered 2020-06-08 (×4): 80 ug via INTRAVENOUS
  Administered 2020-06-08 (×3): 120 ug via INTRAVENOUS
  Administered 2020-06-08 (×2): 80 ug via INTRAVENOUS

## 2020-06-08 MED ORDER — DIPHENHYDRAMINE HCL 50 MG/ML IJ SOLN: 12.5000 mg | INTRAMUSCULAR | Status: DC | PRN

## 2020-06-08 MED ORDER — IBUPROFEN 600 MG PO TABS
600.0000 mg | ORAL_TABLET | Freq: Four times a day (QID) | ORAL | Status: DC
  Administered 2020-06-10 (×3): 600 mg via ORAL
  Filled 2020-06-08 (×3): qty 1

## 2020-06-08 MED ORDER — PHENYLEPHRINE 40 MCG/ML (10ML) SYRINGE FOR IV PUSH (FOR BLOOD PRESSURE SUPPORT)
80.0000 ug | PREFILLED_SYRINGE | INTRAVENOUS | Status: DC | PRN
  Filled 2020-06-08: qty 10

## 2020-06-08 MED ORDER — OXYTOCIN-SODIUM CHLORIDE 30-0.9 UT/500ML-% IV SOLN: 2.5000 [IU]/h | INTRAVENOUS | Status: DC

## 2020-06-08 MED ORDER — SOD CITRATE-CITRIC ACID 500-334 MG/5ML PO SOLN
30.0000 mL | ORAL | Status: DC | PRN
  Filled 2020-06-08: qty 15

## 2020-06-08 MED ORDER — MORPHINE SULFATE (PF) 0.5 MG/ML IJ SOLN
INTRAMUSCULAR | Status: DC | PRN
  Administered 2020-06-08: 3 mg via EPIDURAL

## 2020-06-08 MED ORDER — FENTANYL CITRATE (PF) 100 MCG/2ML IJ SOLN
100.0000 ug | INTRAMUSCULAR | Status: DC | PRN
Start: 2020-06-08 — End: 2020-06-10
  Administered 2020-06-08: 100 ug via INTRAVENOUS
  Filled 2020-06-08 (×2): qty 2

## 2020-06-08 MED ORDER — SODIUM CHLORIDE 0.9% FLUSH: 3.0000 mL | INTRAVENOUS | Status: DC | PRN

## 2020-06-08 MED ORDER — FENTANYL-BUPIVACAINE-NACL 0.5-0.125-0.9 MG/250ML-% EP SOLN
12.0000 mL/h | EPIDURAL | Status: DC | PRN
  Filled 2020-06-08: qty 250

## 2020-06-08 MED ORDER — PROMETHAZINE HCL 25 MG/ML IJ SOLN: 6.2500 mg | INTRAMUSCULAR | Status: DC | PRN

## 2020-06-08 MED ORDER — NALBUPHINE HCL 10 MG/ML IJ SOLN
5.0000 mg | Freq: Once | INTRAMUSCULAR | Status: DC | PRN
Start: 2020-06-08 — End: 2020-06-10

## 2020-06-08 MED ORDER — EPINEPHRINE PF 1 MG/ML IJ SOLN
INTRAMUSCULAR | Status: AC
  Filled 2020-06-08: qty 1

## 2020-06-08 MED ORDER — SODIUM CHLORIDE 0.9 % IV SOLN
5.0000 10*6.[IU] | Freq: Once | INTRAVENOUS | Status: AC
  Administered 2020-06-08: 5 10*6.[IU] via INTRAVENOUS
  Filled 2020-06-08: qty 5

## 2020-06-08 MED ORDER — LIDOCAINE HCL (PF) 2 % IJ SOLN
INTRAMUSCULAR | Status: AC
  Filled 2020-06-08: qty 20

## 2020-06-08 MED ORDER — ONDANSETRON HCL 4 MG/2ML IJ SOLN
INTRAMUSCULAR | Status: DC | PRN
  Administered 2020-06-08: 4 mg via INTRAVENOUS

## 2020-06-08 MED ORDER — ACETAMINOPHEN 500 MG PO TABS: 1000.0000 mg | ORAL_TABLET | Freq: Once | ORAL | Status: DC

## 2020-06-08 MED ORDER — KETOROLAC TROMETHAMINE 30 MG/ML IJ SOLN
30.0000 mg | Freq: Once | INTRAMUSCULAR | Status: AC
  Administered 2020-06-09: 30 mg via INTRAVENOUS

## 2020-06-08 MED ORDER — ONDANSETRON HCL 4 MG/2ML IJ SOLN: 4.0000 mg | Freq: Three times a day (TID) | INTRAMUSCULAR | Status: DC | PRN

## 2020-06-08 MED ORDER — SOD CITRATE-CITRIC ACID 500-334 MG/5ML PO SOLN
30.0000 mL | ORAL | Status: AC
  Administered 2020-06-08: 30 mL via ORAL

## 2020-06-08 MED ORDER — FENTANYL CITRATE (PF) 100 MCG/2ML IJ SOLN
INTRAMUSCULAR | Status: DC | PRN
  Administered 2020-06-08: 100 ug via EPIDURAL

## 2020-06-08 MED ORDER — OXYCODONE-ACETAMINOPHEN 5-325 MG PO TABS: 2.0000 | ORAL_TABLET | ORAL | Status: DC | PRN

## 2020-06-08 MED ORDER — LACTATED RINGERS IV SOLN: 500.0000 mL | Freq: Once | INTRAVENOUS | Status: DC

## 2020-06-08 MED ORDER — LIDOCAINE-EPINEPHRINE (PF) 2 %-1:200000 IJ SOLN
INTRAMUSCULAR | Status: DC | PRN
  Administered 2020-06-08: 2 mL via EPIDURAL
  Administered 2020-06-08 (×2): 5 mL via EPIDURAL

## 2020-06-08 MED ORDER — DIPHENHYDRAMINE HCL 25 MG PO CAPS: 25.0000 mg | ORAL_CAPSULE | Freq: Four times a day (QID) | ORAL | Status: DC | PRN

## 2020-06-08 MED ORDER — OXYTOCIN BOLUS FROM INFUSION: 333.0000 mL | Freq: Once | INTRAVENOUS | Status: DC

## 2020-06-08 MED ORDER — LIDOCAINE HCL (PF) 1 % IJ SOLN: 30.0000 mL | INTRAMUSCULAR | Status: DC | PRN

## 2020-06-08 MED ORDER — LACTATED RINGERS IV SOLN: INTRAVENOUS | Status: DC | PRN

## 2020-06-08 MED ORDER — ACETAMINOPHEN 325 MG PO TABS: 650.0000 mg | ORAL_TABLET | ORAL | Status: DC | PRN

## 2020-06-08 MED ORDER — SODIUM CHLORIDE 0.9 % IV SOLN: INTRAVENOUS | Status: DC | PRN

## 2020-06-08 MED ORDER — DEXAMETHASONE SODIUM PHOSPHATE 10 MG/ML IJ SOLN
INTRAMUSCULAR | Status: DC | PRN
  Administered 2020-06-08: 10 mg via INTRAVENOUS

## 2020-06-08 MED ORDER — TERBUTALINE SULFATE 1 MG/ML IJ SOLN: 0.2500 mg | Freq: Once | INTRAMUSCULAR | Status: DC | PRN

## 2020-06-08 SURGICAL SUPPLY — 26 items
CLAMP CORD UMBIL (MISCELLANEOUS) ×2 IMPLANT
CLOTH BEACON ORANGE TIMEOUT ST (SAFETY) ×2 IMPLANT
DRESSING PREVENA PLUS CUSTOM (GAUZE/BANDAGES/DRESSINGS) ×1 IMPLANT
DRSG PREVENA PLUS CUSTOM (GAUZE/BANDAGES/DRESSINGS) ×2
ELECT REM PT RETURN 9FT ADLT (ELECTROSURGICAL) ×2
ELECTRODE REM PT RTRN 9FT ADLT (ELECTROSURGICAL) ×1 IMPLANT
GOWN STRL REUS W/TWL LRG LVL3 (GOWN DISPOSABLE) ×2 IMPLANT
KIT ABG SYR 3ML LUER SLIP (SYRINGE) ×2 IMPLANT
KIT PREVENA INCISION MGT20CM45 (CANNISTER) ×2 IMPLANT
NEEDLE HYPO 25X5/8 SAFETYGLIDE (NEEDLE) ×4 IMPLANT
NS IRRIG 1000ML POUR BTL (IV SOLUTION) ×2 IMPLANT
PACK C SECTION WH (CUSTOM PROCEDURE TRAY) ×2 IMPLANT
PAD OB MATERNITY 4.3X12.25 (PERSONAL CARE ITEMS) ×4 IMPLANT
PENCIL SMOKE EVAC W/HOLSTER (ELECTROSURGICAL) ×2 IMPLANT
RETRACTOR TRAXI PANNICULUS (MISCELLANEOUS) ×1 IMPLANT
RTRCTR C-SECT PINK 25CM LRG (MISCELLANEOUS) ×2 IMPLANT
SPONGE LAP 18X18 X RAY DECT (DISPOSABLE) ×2 IMPLANT
SUT MNCRL 0 VIOLET CTX 36 (SUTURE) ×2 IMPLANT
SUT MONOCRYL 0 CTX 36 (SUTURE) ×2
SUT VIC AB 0 CT1 36 (SUTURE) ×4 IMPLANT
SUT VIC AB 4-0 KS 27 (SUTURE) ×2 IMPLANT
SYR 3ML 25GX5/8 SAFETY (SYRINGE) ×2 IMPLANT
TOWEL OR 17X24 6PK STRL BLUE (TOWEL DISPOSABLE) ×2 IMPLANT
TRAXI PANNICULUS RETRACTOR (MISCELLANEOUS) ×1
TRAY FOLEY W/BAG SLVR 14FR LF (SET/KITS/TRAYS/PACK) ×2 IMPLANT
WATER STERILE IRR 1000ML POUR (IV SOLUTION) ×4 IMPLANT

## 2020-06-08 NOTE — Op Note (Signed)
CESAREAN SECTION Procedure Note  Patient: Kimberly Kaiser is a 35 y.o. G3P0020 @ [redacted]w[redacted]d admitted for induction of labor for gestational hypertension  Preoperative Diagnosis:  1. Nonreassuring fetal heart tracing 2. Gestational hypertension  Postoperative Diagnosis: same, delivered  Procedure: Primary low transverse cesarean     Surgeon: Charlett Nose , MD Assistant: Lynnda Shields, MD  Anesthesia: Epidural anesthesia   Findings: Normal appearing uterus, fallopian tubes bilaterally, and ovaries bilaterally.  Viable female infant in vertex presentation delivered at 2210 with weight pending, Apgars 3 and 8. Cord pH arterial: 7.026  Estimated Blood Loss: 804 mL         Specimens: Placenta to pathology         Complications:  None         Disposition: PACU - hemodynamically stable.         Condition: stable    Description of Procedure: After decision was made to proceed with cesarean for non reassuring fetal heart tracing due to recurrent decelerations, fetal heart tracing developed repetitive decelerations and then terminal bradycardia in the 80-90s. She was brought rapidly to the operating room. Epidural anesthesia was rebolused and confirmed adequate. Fetal heart rate was 88bpm thus decision was made to proceed with stat primary cesarean. Ancef 3g was administered for infection prophylaxis. SCDs were already on and cycling. Foley already in place and draining.   Betadine was splashed onto the patient's abdomen and she was draped in normal sterile fashion. A low transverse skin incision was made with a scalpel and carried down to the level of the fascia.  The fascia was incised in the midline with the scalpel and extended bluntly. The fascia was dissected off the rectus muscle bluntly. The rectus muscles then were separated in the midline.  The peritoneum was found free of adherent bowel and the peritoneal cavity was entered bluntly.  A bladder blade was inserted.   A low transverse  hysterotomy was then made with a scalpel. The amniotic sac was ruptured with clear fluid. The infant was found in the vertex presentation was delivered atraumatically and without difficulty with standard maneuvers. After 30 seconds of delayed cord clamping the cord was clamped and cut and the infant was handed off to the pediatricians. Cord gas was collected. The placenta quickly without traction. The uterus was exteriorized and cleared of all clot and debris.   The hysterotomy was then closed with 0 monocryl in a running locked fashion,  followed by 0 Monocryl in an imbricating fashion.  The hysterotomy was found to be hemostatic. The fascia was closed with a 0 Vicryl suture in a continuous running fashion.  The subcutaneous tissue was irrigated and rendered hemostatic with cautery.  The subcutaneous layer was subsequently closed with 3-0 Vicryl in a continuous running fashion.  The skin was closed with 4-0 vicryl  in a running subcuticular fashion.  Sponge, lap and needle counts were correct. A Prevena wound vac was applied.  The patient tolerated the procedure well and was taken to the recovery room in stable condition.   Charlett Nose 06/08/20  11:12 PM

## 2020-06-08 NOTE — Anesthesia Preprocedure Evaluation (Addendum)
Anesthesia Evaluation  Patient identified by MRN, date of birth, ID band Patient awake    Reviewed: Allergy & Precautions, NPO status , Patient's Chart, lab work & pertinent test results  History of Anesthesia Complications Negative for: history of anesthetic complications  Airway Mallampati: II  TM Distance: >3 FB Neck ROM: Full    Dental no notable dental hx.    Pulmonary former smoker,    Pulmonary exam normal        Cardiovascular hypertension (gestational), Normal cardiovascular exam     Neuro/Psych Anxiety Depression negative neurological ROS     GI/Hepatic negative GI ROS, Neg liver ROS,   Endo/Other  Morbid obesity (BMI 48)  Renal/GU negative Renal ROS  negative genitourinary   Musculoskeletal negative musculoskeletal ROS (+)   Abdominal   Peds  Hematology negative hematology ROS (+)   Anesthesia Other Findings Day of surgery medications reviewed with patient.  Reproductive/Obstetrics (+) Pregnancy                            Anesthesia Physical Anesthesia Plan  ASA: III and emergent  Anesthesia Plan: Epidural   Post-op Pain Management:    Induction:   PONV Risk Score and Plan: 3 and Treatment may vary due to age or medical condition, Ondansetron and Dexamethasone  Airway Management Planned: Natural Airway  Additional Equipment:   Intra-op Plan:   Post-operative Plan:   Informed Consent: I have reviewed the patients History and Physical, chart, labs and discussed the procedure including the risks, benefits and alternatives for the proposed anesthesia with the patient or authorized representative who has indicated his/her understanding and acceptance.       Plan Discussed with: CRNA  Anesthesia Plan Comments: (Epidural to be used for urgent C/S (NRFHT). Stephannie Peters, MD)        Anesthesia Quick Evaluation

## 2020-06-08 NOTE — Transfer of Care (Signed)
Immediate Anesthesia Transfer of Care Note  Patient: Kimberly Kaiser  Procedure(s) Performed: CESAREAN SECTION  Patient Location: PACU  Anesthesia Type:Epidural  Level of Consciousness: awake, alert  and oriented  Airway & Oxygen Therapy: Patient Spontanous Breathing  Post-op Assessment: Report given to RN and Post -op Vital signs reviewed and stable  Post vital signs: Reviewed and stable  Last Vitals:  Vitals Value Taken Time  BP    Temp 36.3 C 06/08/20 2323  Pulse 102 06/08/20 2325  Resp 20 06/08/20 2325  SpO2 100 % 06/08/20 2325  Vitals shown include unvalidated device data.  Last Pain:  Vitals:   06/08/20 2323  TempSrc: Oral  PainSc:       Patients Stated Pain Goal: 0 (06/08/20 1837)  Complications: No complications documented.

## 2020-06-08 NOTE — Progress Notes (Signed)
OB Progress Note  S:Patient comfortable with epidural   O: Today's Vitals   06/08/20 1925 06/08/20 1930 06/08/20 1936 06/08/20 2005  BP: 113/61 116/72 122/73 134/81  Pulse: (!) 102 98 94 97  Resp:   18 17  Temp:   98.2 F (36.8 C)   TempSrc:   Oral   SpO2:    100%  Weight:      Height:      PainSc:       Body mass index is 48.21 kg/m.  SVE 2/70/-3 (unchanged from exam 2 hours ago)  FHR: 145bpm, moderate variability, absent accels, late decelerations Toco: q 2 minutes   A/P: 35Y G3P0020 @ [redacted]w[redacted]d, IOL for gestational hypertension - Around 1915, patient had prolonged deceleration (6 mins) after receiving epidural. After resuscitative measures, tracing initially improved. However, since that time, has been having runs of decelerations. It has been hard to trace contractions due to maternal habitus, but when contractions are tracing, decelerations appear to be late in nature. At this point in time, she is having late decelerations with >50% contractions, which are not responding to resuscitative measures. Her exam is unchanged at 2cm. Her last dose of cytotec was around 1pm. I discussed my concern for fetal intolerance to labor with the patient and her mother. I do recommend a primary cesarean at this point due to nonreassuring fetal heart tracing. We discussed risks of the procedure including infection, bleeding, transfusion, damage to surrounding organs, blood clot. All questions answered. Patient agreed to cesarean and signed consent. - L&D OR notified - Ancef 3g - Foley in place  M. Timothy Lasso, MD 06/08/20 9:53 PM

## 2020-06-08 NOTE — H&P (Addendum)
Naveena Eyman is a 35 y.o. female G3P0020 [redacted]w[redacted]d presenting for induction of labor for gestational hypertesnion. She reports no LOF, VB, Contractions. Normal FM.   Pregnancy c/b: 1. Gestational hypertension: diagnosed 06/02/20 due to mild range BP elevations, labs normal at that time, urine P/C 0.11 2. Obesity: initial OB appt BMI 40 3. Rh neg: received Rhogam 04/08/20 4. History of D&C for missed abortion in 2013    OB History    Gravida  3   Para      Term      Preterm      AB  2   Living        SAB  1   IAB      Ectopic  1   Multiple      Live Births             Past Medical History:  Diagnosis Date  . Anxiety   . Depression   . PCOS (polycystic ovarian syndrome)   . Pregnancy induced hypertension    Past Surgical History:  Procedure Laterality Date  . DILATION AND CURETTAGE OF UTERUS    . TONSILLECTOMY    . TONSILLECTOMY AND ADENOIDECTOMY     at age of 69    Family History: family history includes Anxiety disorder in her sister; Asthma in her sister; Cancer in her maternal grandmother; Depression in her sister; Diabetes in her father and paternal grandmother; Drug abuse in her brother; Hypertension in her father and mother; Obesity in her mother and sister; Varicose Veins in her mother. Social History:  reports that she has quit smoking. Her smoking use included cigarettes. She smoked 0.25 packs per day. She has never used smokeless tobacco. She reports previous alcohol use. She reports previous drug use.     Maternal Diabetes: No Genetic Screening: Normal Maternal Ultrasounds/Referrals: Normal Fetal Ultrasounds or other Referrals:  5/5 EFW 6lb14oz, (78%) Maternal Substance Abuse:  Smoker, quit March 2022 Significant Maternal Medications:  None Significant Maternal Lab Results:  Group B Strep positive Other Comments:  None  Review of Systems Per HPI Exam Physical Exam  Dilation: Closed Effacement (%): 50 Station: Ballotable Exam by:: Fabio Neighbors RN Blood pressure 136/78, pulse (!) 102, temperature 98 F (36.7 C), temperature source Oral, resp. rate 15, height 5' 5.5" (1.664 m), weight 133.4 kg, last menstrual period 09/19/2019. Gen: NAD, resting comfortably Lungs: no accessory muscle usage Abd: Gravid abdomen, obese, soft Psych: normal affect Neuro: no motor deficits  Fetal testing: 125bpm, moderate variability, + accels, no decels Toco: ctx q 2-5 min Prenatal labs: ABO, Rh:  --/--/O NEG (05/11 0020) Antibody: NEG (05/11 0020) Rubella: Immune (12/10 0000) RPR: Nonreactive (12/10 0000)  HBsAg: Negative (12/10 0000)  HIV: Non-reactive (12/10 0000)  GBS: Negative/-- (05/09 0000)   Assessment/Plan: 35Y G3P0020 @ [redacted]w[redacted]d, IOL for gestational HTN 1. IOL: received cytotec x 3 overnight, continue q4hr PRN cervical ripening 2. GHTN: normal range Bps since admission, normal labs 06/02/20 3. GBS+: penicillin 4. Pain control: epidural upon patient request  Charlett Nose 06/08/2020, 10:10 AM

## 2020-06-08 NOTE — Anesthesia Procedure Notes (Signed)
Epidural Patient location during procedure: OB Start time: 06/08/2020 6:54 PM End time: 06/08/2020 7:00 PM  Staffing Anesthesiologist: Kaylyn Layer, MD Performed: anesthesiologist   Preanesthetic Checklist Completed: patient identified, IV checked, risks and benefits discussed, monitors and equipment checked, pre-op evaluation and timeout performed  Epidural Patient position: sitting Prep: DuraPrep and site prepped and draped Patient monitoring: continuous pulse ox, blood pressure and heart rate Approach: midline Location: L3-L4 Injection technique: LOR air  Needle:  Needle type: Tuohy  Needle gauge: 17 G Needle length: 9 cm Needle insertion depth: 9 cm Catheter type: closed end flexible Catheter size: 19 Gauge Catheter at skin depth: 14 cm Test dose: negative and Other (1% lidocaine)  Assessment Events: blood not aspirated, injection not painful, no injection resistance, no paresthesia and negative IV test  Additional Notes Patient identified. Risks, benefits, and alternatives discussed with patient including but not limited to bleeding, infection, nerve damage, paralysis, failed block, incomplete pain control, headache, blood pressure changes, nausea, vomiting, reactions to medication, itching, and postpartum back pain. Confirmed with bedside nurse the patient's most recent platelet count. Confirmed with patient that they are not currently taking any anticoagulation, have any bleeding history, or any family history of bleeding disorders. Patient expressed understanding and wished to proceed. All questions were answered. Sterile technique was used throughout the entire procedure. Please see nursing notes for vital signs.   Difficult placement due to habitus, 2 attempts with multiple needle passes required. Test dose was given through epidural catheter and negative prior to continuing to dose epidural or start infusion. Warning signs of high block given to the patient including  shortness of breath, tingling/numbness in hands, complete motor block, or any concerning symptoms with instructions to call for help. Patient was given instructions on fall risk and not to get out of bed. All questions and concerns addressed with instructions to call with any issues or inadequate analgesia.  Reason for block:procedure for pain

## 2020-06-09 ENCOUNTER — Encounter (HOSPITAL_COMMUNITY): Payer: Self-pay | Admitting: Obstetrics and Gynecology

## 2020-06-09 ENCOUNTER — Ambulatory Visit: Payer: Medicaid Other

## 2020-06-09 LAB — CBC
HCT: 33.9 % — ABNORMAL LOW (ref 36.0–46.0)
Hemoglobin: 10.9 g/dL — ABNORMAL LOW (ref 12.0–15.0)
MCH: 26.5 pg (ref 26.0–34.0)
MCHC: 32.2 g/dL (ref 30.0–36.0)
MCV: 82.3 fL (ref 80.0–100.0)
Platelets: 404 10*3/uL — ABNORMAL HIGH (ref 150–400)
RBC: 4.12 MIL/uL (ref 3.87–5.11)
RDW: 14.3 % (ref 11.5–15.5)
WBC: 21.1 10*3/uL — ABNORMAL HIGH (ref 4.0–10.5)
nRBC: 0 % (ref 0.0–0.2)

## 2020-06-09 MED ORDER — DIPHENHYDRAMINE HCL 25 MG PO CAPS: 25.0000 mg | ORAL_CAPSULE | Freq: Four times a day (QID) | ORAL | Status: DC | PRN

## 2020-06-09 MED ORDER — PRENATAL MULTIVITAMIN CH
1.0000 | ORAL_TABLET | Freq: Every day | ORAL | Status: DC
  Administered 2020-06-09 – 2020-06-10 (×2): 1 via ORAL
  Filled 2020-06-09 (×2): qty 1

## 2020-06-09 MED ORDER — DIBUCAINE (PERIANAL) 1 % EX OINT: 1.0000 "application " | TOPICAL_OINTMENT | CUTANEOUS | Status: DC | PRN

## 2020-06-09 MED ORDER — ENOXAPARIN SODIUM 80 MG/0.8ML IJ SOSY
70.0000 mg | PREFILLED_SYRINGE | INTRAMUSCULAR | Status: DC
  Administered 2020-06-09: 70 mg via SUBCUTANEOUS
  Filled 2020-06-09: qty 0.8

## 2020-06-09 MED ORDER — HYDROMORPHONE HCL 1 MG/ML IJ SOLN: 0.2000 mg | INTRAMUSCULAR | Status: DC | PRN

## 2020-06-09 MED ORDER — WITCH HAZEL-GLYCERIN EX PADS: 1.0000 "application " | MEDICATED_PAD | CUTANEOUS | Status: DC | PRN

## 2020-06-09 MED ORDER — SIMETHICONE 80 MG PO CHEW
80.0000 mg | CHEWABLE_TABLET | ORAL | Status: DC | PRN
Start: 2020-06-09 — End: 2020-06-10

## 2020-06-09 MED ORDER — OXYCODONE HCL 5 MG PO TABS
5.0000 mg | ORAL_TABLET | ORAL | Status: DC | PRN
  Administered 2020-06-09: 5 mg via ORAL
  Administered 2020-06-10: 10 mg via ORAL
  Filled 2020-06-09: qty 2
  Filled 2020-06-09: qty 1

## 2020-06-09 MED ORDER — BISACODYL 10 MG RE SUPP: 10.0000 mg | Freq: Every day | RECTAL | Status: DC | PRN

## 2020-06-09 MED ORDER — SENNOSIDES-DOCUSATE SODIUM 8.6-50 MG PO TABS
2.0000 | ORAL_TABLET | ORAL | Status: DC
  Administered 2020-06-09 – 2020-06-10 (×2): 2 via ORAL
  Filled 2020-06-09 (×2): qty 2

## 2020-06-09 MED ORDER — LACTATED RINGERS IV SOLN: INTRAVENOUS | Status: DC

## 2020-06-09 MED ORDER — TRANEXAMIC ACID-NACL 1000-0.7 MG/100ML-% IV SOLN: 1000.0000 mg | Freq: Once | INTRAVENOUS | Status: DC | PRN

## 2020-06-09 MED ORDER — ACETAMINOPHEN 500 MG PO TABS
1000.0000 mg | ORAL_TABLET | Freq: Four times a day (QID) | ORAL | Status: DC
  Administered 2020-06-09 – 2020-06-10 (×5): 1000 mg via ORAL
  Filled 2020-06-09 (×5): qty 2

## 2020-06-09 MED ORDER — DIPHENHYDRAMINE HCL 50 MG/ML IJ SOLN
INTRAMUSCULAR | Status: AC
  Filled 2020-06-09: qty 1

## 2020-06-09 MED ORDER — COCONUT OIL OIL: 1.0000 "application " | TOPICAL_OIL | Status: DC | PRN

## 2020-06-09 MED ORDER — OXYTOCIN-SODIUM CHLORIDE 30-0.9 UT/500ML-% IV SOLN
2.5000 [IU]/h | INTRAVENOUS | Status: AC
  Administered 2020-06-09 (×2): 2.5 [IU]/h via INTRAVENOUS
  Filled 2020-06-09: qty 500

## 2020-06-09 MED ORDER — FLEET ENEMA 7-19 GM/118ML RE ENEM: 1.0000 | ENEMA | Freq: Every day | RECTAL | Status: DC | PRN

## 2020-06-09 MED ORDER — MISOPROSTOL 200 MCG PO TABS
1000.0000 ug | ORAL_TABLET | Freq: Once | ORAL | Status: AC
  Administered 2020-06-09: 1000 ug via RECTAL
  Filled 2020-06-09: qty 5

## 2020-06-09 MED ORDER — MENTHOL 3 MG MT LOZG: 1.0000 | LOZENGE | OROMUCOSAL | Status: DC | PRN

## 2020-06-09 MED ORDER — KETOROLAC TROMETHAMINE 30 MG/ML IJ SOLN
INTRAMUSCULAR | Status: AC
  Filled 2020-06-09: qty 1

## 2020-06-09 NOTE — Anesthesia Postprocedure Evaluation (Signed)
Anesthesia Post Note  Patient: Kimberly Kaiser  Procedure(s) Performed: CESAREAN SECTION     Patient location during evaluation: PACU Anesthesia Type: Epidural Level of consciousness: awake and alert and oriented Pain management: pain level controlled Vital Signs Assessment: post-procedure vital signs reviewed and stable Respiratory status: spontaneous breathing, nonlabored ventilation and respiratory function stable Cardiovascular status: blood pressure returned to baseline Postop Assessment: epidural receding, no apparent nausea or vomiting, no headache and no backache Anesthetic complications: no   No complications documented.  Last Vitals:  Vitals:   06/09/20 0040 06/09/20 0042  BP:    Pulse: 83 88  Resp: 16 (!) 21  Temp:  37.3 C  SpO2: 100% 99%    Last Pain:  Vitals:   06/09/20 0042  TempSrc: Axillary  PainSc:    Pain Goal: Patients Stated Pain Goal: 0 (06/08/20 1837)      RLE Motor Response: Purposeful movement (06/09/20 0010) RLE Sensation: Tingling (06/09/20 0010)        Kaylyn Layer

## 2020-06-09 NOTE — Progress Notes (Addendum)
Subjective: Postpartum Day 1: Cesarean Delivery Patient reports: tolerating PO. Denies lightheadedness or dizziness, pain controlled.  Objective: Patient Vitals for the past 24 hrs:  BP Temp Temp src Pulse Resp SpO2  06/09/20 0456 117/69 98.5 F (36.9 C) Oral (!) 103 18 99 %  06/09/20 0351 124/87 98.2 F (36.8 C) Oral 100 20 97 %  06/09/20 0204 126/84 98.2 F (36.8 C) Oral (!) 102 18 98 %  06/09/20 0100 122/73 99.3 F (37.4 C) Oral (!) 101 18 98 %  06/09/20 0050 -- -- -- 98 (!) 21 99 %  06/09/20 0045 113/75 -- -- 96 14 99 %  06/09/20 0042 -- 99.1 F (37.3 C) Axillary 88 (!) 21 99 %  06/09/20 0040 -- -- -- 83 16 100 %  06/09/20 0035 -- -- -- 93 16 99 %  06/09/20 0030 108/69 -- -- 94 15 99 %  06/09/20 0025 -- -- -- 92 19 100 %  06/09/20 0020 126/77 -- -- (!) 103 (!) 26 99 %  06/09/20 0015 -- -- -- 94 15 100 %  06/09/20 0010 -- -- -- 100 16 100 %  06/09/20 0005 -- -- -- 94 18 99 %  06/09/20 0000 110/71 -- -- 96 18 100 %  06/08/20 2355 -- -- -- 97 (!) 22 99 %  06/08/20 2350 -- -- -- 99 (!) 22 100 %  06/08/20 2345 108/75 -- -- 99 (!) 21 100 %  06/08/20 2340 -- -- -- (!) 103 20 100 %  06/08/20 2335 -- -- -- (!) 104 17 100 %  06/08/20 2330 94/76 -- -- (!) 101 20 100 %  06/08/20 2325 -- -- -- (!) 101 (!) 21 100 %  06/08/20 2323 -- (!) 97.4 F (36.3 C) Oral 99 (!) 24 100 %  06/08/20 2200 131/78 -- -- (!) 125 17 --  06/08/20 2155 137/66 -- -- (!) 128 17 --  06/08/20 2130 121/81 -- -- 93 18 --  06/08/20 2100 (!) 114/59 -- -- 99 17 --  06/08/20 2030 131/82 -- -- 95 -- --  06/08/20 2005 134/81 -- -- 97 17 100 %  06/08/20 2000 136/88 -- -- 98 18 --  06/08/20 1955 132/84 -- -- 96 -- --  06/08/20 1950 132/87 -- -- 96 -- --  06/08/20 1945 118/70 -- -- (!) 101 -- --  06/08/20 1940 124/69 -- -- 98 -- --  06/08/20 1936 122/73 98.2 F (36.8 C) Oral 94 18 --  06/08/20 1930 116/72 -- -- 98 -- --  06/08/20 1925 113/61 -- -- (!) 102 -- --  06/08/20 1920 109/65 -- -- (!) 109 -- --   06/08/20 1915 126/60 -- -- 98 -- --  06/08/20 1910 109/69 -- -- (!) 109 -- --  06/08/20 1905 121/78 -- -- (!) 103 -- --  06/08/20 1900 123/70 -- -- (!) 177 -- --  06/08/20 1838 (!) 127/52 -- -- 99 -- --  06/08/20 1700 (!) 127/91 98 F (36.7 C) Axillary 97 16 --  06/08/20 1324 (!) 102/51 -- -- 96 16 --  06/08/20 1253 130/73 98.1 F (36.7 C) Oral 88 16 --  06/08/20 0932 -- 98.4 F (36.9 C) Axillary -- -- --  06/08/20 0839 136/78 -- -- (!) 102 15 --   UOP 29cc/h  Physical Exam:  General: alert and no distress Lochia: appropriate Uterine Fundus: firm Incision: provena wound vac in place DVT Evaluation: No evidence of DVT seen on physical exam.  Recent Labs  06/08/20 1752 06/09/20 0500  HGB 13.0 10.9*  HCT 39.8 33.9*    Assessment/Plan: Kimberly Kaiser [id number] POD#1 sp 1CS at [redacted]w[redacted]d for NRFS, GHTN. Doing appropriately postop, continue current care 1. PPC: Prevena wound vac 2. ABLA/PPH: Hgb 13>10.9, EBL 1140, uterine atony on PP, given cytotec. Now improved. Plan PO iron at discharge  3.  GHTN: majority BP on L&D normotensive to mild range. Continue to monitor 4. Obesity: initial BMI 40 at new OB, Current BMI 48. Plan lovenox for prophylaxis  5. Rh negative: baby O neg. Rhogam not indicated 6. Circ: desires circ, baby in nursery this AM, will hold off until baby in room with mom Rubella Immune, blood type O NEG, bottle and pumping. Completed vaccines: tdap, flu, COVID series. Booster due 07/2020  Kimberly Kaiser 06/09/2020, 8:11 AM

## 2020-06-09 NOTE — Social Work (Signed)
MOB was referred for history of depression and anxiety.   * Referral screened out by Clinical Social Worker because none of the following criteria appear to apply:  ~ History of anxiety/depression during this pregnancy, or of post-partum depression following prior delivery. No concerns noted in prenatal care. ~ Diagnosis of anxiety and/or depression within last 3 years. CSW reviewed chart and notes a diagnosis age of 35 years old.  OR * MOB's symptoms currently being treated with medication and/or therapy.  Please contact the Clinical Social Worker if needs arise, by [facility] request, or if MOB scores greater than 9/yes to question 10 on Edinburgh Postpartum Depression Screen.  Manfred Arch, LCSWA Clinical Social Work [employer] and [employer]  [phone]

## 2020-06-10 LAB — CBC
HCT: 27.4 % — ABNORMAL LOW (ref 36.0–46.0)
Hemoglobin: 8.5 g/dL — ABNORMAL LOW (ref 12.0–15.0)
MCH: 26.4 pg (ref 26.0–34.0)
MCHC: 31 g/dL (ref 30.0–36.0)
MCV: 85.1 fL (ref 80.0–100.0)
Platelets: 367 10*3/uL (ref 150–400)
RBC: 3.22 MIL/uL — ABNORMAL LOW (ref 3.87–5.11)
RDW: 14.6 % (ref 11.5–15.5)
WBC: 13.2 10*3/uL — ABNORMAL HIGH (ref 4.0–10.5)
nRBC: 0 % (ref 0.0–0.2)

## 2020-06-10 MED ORDER — IBUPROFEN 600 MG PO TABS: 600.0000 mg | ORAL_TABLET | Freq: Four times a day (QID) | ORAL | 0 refills | Status: AC | PRN

## 2020-06-10 MED ORDER — OXYCODONE-ACETAMINOPHEN 5-325 MG PO TABS: 1.0000 | ORAL_TABLET | Freq: Four times a day (QID) | ORAL | 0 refills | Status: DC | PRN

## 2020-06-10 NOTE — Lactation Note (Signed)
This note was copied from a baby's chart. Lactation Consultation Note  Patient Name: Boy Basma Buchner [id number] Date: 06/10/2020 Reason for consult: Initial assessment;Early term 37-38.6wks;Primapara;1st time breastfeeding Age:33 hours  Visited with mom of 61 hours old ETI female, she's a P1 and reported (+) breast changes during the pregnancy. She's planning on exclusively pumping and bottle feeding, reviewed hand expression, lactogenesis II, pumping schedule, supply/demand and supplementation guidelines according to baby's age in hours.  LC assisted mom with pumping session and hand expression, but no colostrum noted yet. Noticed her left nipple is inverted and the right one is short shafted, tissue is not very compressible. She's been feeding baby 100% formula during hospital stay but plans to replace it with breastmilk once her milk comes in, praised her for her efforts.  Mom has been pumping during hospital stay but no colostrum has been noted, other than small droplets. Explained to mom that pumping this early on is mainly for breast stimulation and not to get volume, she voiced understanding.   Feeding plan:  1. Encouraged mom to pump every 2-3 hours, ideally 8 pumping sessions/24 hours 2. Hand expression and breast massage were also encouraged prior pumping 3. Mom will continue supplementing/feeding baby with Similac 20 calorie formula until her milk comes in  BF brochure, BF resources, feeding diary and supplementation handout were reviewed. GOB (maternal) present and supportive. Family reported all questions and concerns were answered, she's aware of LC OP services and will call PRN.  Maternal Data Has patient been taught Hand Expression?: Yes Does the patient have breastfeeding experience prior to this delivery?: No  Feeding Mother's Current Feeding Choice: Breast Milk and Formula Nipple Type: Slow - flow  Lactation Tools Discussed/Used Tools: Pump;Coconut oil;Flanges Flange  Size: 24 Breast pump type: Double-Electric Breast Pump Pump Education: Setup, frequency, and cleaning;Milk Storage Reason for Pumping: pump and bottle feeding Pumping frequency: q 3 hours Pumped volume:  (drops)  Interventions Interventions: Breast feeding basics reviewed;Education;Coconut oil;DEBP;Breast massage;Hand express  Discharge Discharge Education: Engorgement and breast care;Warning signs for feeding baby Pump: Personal;DEBP (DEBP at home (Eco mom)) [facility] Program: Yes  Consult Status Consult Status: Complete    Diego Delancey S Barnie Sopko 06/10/2020, 1:00 PM

## 2020-06-13 LAB — SURGICAL PATHOLOGY

## 2020-06-16 ENCOUNTER — Encounter (HOSPITAL_COMMUNITY): Payer: Self-pay | Admitting: Obstetrics and Gynecology

## 2020-06-16 ENCOUNTER — Ambulatory Visit: Payer: Medicaid Other

## 2020-06-16 ENCOUNTER — Inpatient Hospital Stay (HOSPITAL_COMMUNITY)
Admission: AD | Admit: 2020-06-16 | Discharge: 2020-06-16 | Disposition: A | Discharge status: Home / Self Care | Payer: Medicaid Other | Attending: Obstetrics and Gynecology | Admitting: Obstetrics and Gynecology

## 2020-06-16 DIAGNOSIS — O9089 Other complications of the puerperium, not elsewhere classified: Secondary | ICD-10-CM | POA: Insufficient documentation

## 2020-06-16 DIAGNOSIS — O135 Gestational [pregnancy-induced] hypertension without significant proteinuria, complicating the puerperium: Secondary | ICD-10-CM | POA: Insufficient documentation

## 2020-06-16 DIAGNOSIS — R519 Headache, unspecified: Secondary | ICD-10-CM | POA: Diagnosis present

## 2020-06-16 DIAGNOSIS — Z79899 Other long term (current) drug therapy: Secondary | ICD-10-CM | POA: Diagnosis not present

## 2020-06-16 DIAGNOSIS — Z87891 Personal history of nicotine dependence: Secondary | ICD-10-CM | POA: Diagnosis not present

## 2020-06-16 DIAGNOSIS — O99893 Other specified diseases and conditions complicating puerperium: Secondary | ICD-10-CM

## 2020-06-16 LAB — CBC
HCT: 33.8 % — ABNORMAL LOW (ref 36.0–46.0)
Hemoglobin: 10.4 g/dL — ABNORMAL LOW (ref 12.0–15.0)
MCH: 25.9 pg — ABNORMAL LOW (ref 26.0–34.0)
MCHC: 30.8 g/dL (ref 30.0–36.0)
MCV: 84.3 fL (ref 80.0–100.0)
Platelets: 653 10*3/uL — ABNORMAL HIGH (ref 150–400)
RBC: 4.01 MIL/uL (ref 3.87–5.11)
RDW: 14.3 % (ref 11.5–15.5)
WBC: 10.7 10*3/uL — ABNORMAL HIGH (ref 4.0–10.5)
nRBC: 0.2 % (ref 0.0–0.2)

## 2020-06-16 LAB — COMPREHENSIVE METABOLIC PANEL
ALT: 25 U/L (ref 0–44)
AST: 17 U/L (ref 15–41)
Albumin: 2.7 g/dL — ABNORMAL LOW (ref 3.5–5.0)
Alkaline Phosphatase: 90 U/L (ref 38–126)
Anion gap: 6 (ref 5–15)
BUN: 13 mg/dL (ref 6–20)
CO2: 25 mmol/L (ref 22–32)
Calcium: 8.9 mg/dL (ref 8.9–10.3)
Chloride: 105 mmol/L (ref 98–111)
Creatinine, Ser: 0.76 mg/dL (ref 0.44–1.00)
GFR, Estimated: >60 mL/min (ref >60)
Glucose, Bld: 99 mg/dL (ref 70–99)
Potassium: 4.2 mmol/L (ref 3.5–5.1)
Sodium: 136 mmol/L (ref 135–145)
Total Bilirubin: 0.1 mg/dL — ABNORMAL LOW (ref 0.3–1.2)
Total Protein: 6.2 g/dL — ABNORMAL LOW (ref 6.5–8.1)

## 2020-06-16 MED ORDER — DIPHENHYDRAMINE HCL 50 MG/ML IJ SOLN
25.0000 mg | Freq: Once | INTRAMUSCULAR | Status: AC
  Administered 2020-06-16: 25 mg via INTRAVENOUS
  Filled 2020-06-16: qty 1

## 2020-06-16 MED ORDER — DEXAMETHASONE SODIUM PHOSPHATE 10 MG/ML IJ SOLN
10.0000 mg | Freq: Once | INTRAMUSCULAR | Status: AC
  Administered 2020-06-16: 10 mg via INTRAVENOUS
  Filled 2020-06-16: qty 1

## 2020-06-16 MED ORDER — LACTATED RINGERS IV BOLUS
1000.0000 mL | Freq: Once | INTRAVENOUS | Status: AC
  Administered 2020-06-16: 1000 mL via INTRAVENOUS

## 2020-06-16 MED ORDER — METOCLOPRAMIDE HCL 5 MG/ML IJ SOLN
10.0000 mg | Freq: Once | INTRAMUSCULAR | Status: AC
  Administered 2020-06-16: 10 mg via INTRAVENOUS
  Filled 2020-06-16: qty 2

## 2020-06-16 NOTE — Discharge Instructions (Signed)
General Headache Without Cause A headache is pain or discomfort felt around the head or neck area. The specific cause of a headache may not be found. There are many causes and types of headaches. A few common ones are:  Tension headaches.  Migraine headaches.  Cluster headaches.  Chronic daily headaches. Follow these instructions at home: Watch your condition for any changes. Let your health care provider know about them. Take these steps to help with your condition: Managing pain  Take over-the-counter and prescription medicines only as told by your health care provider.  Lie down in a dark, quiet room when you have a headache.  If directed, put ice on your head and neck area: ? Put ice in a plastic bag. ? Place a towel between your skin and the bag. ? Leave the ice on for 20 minutes, 2-3 times per day.  If directed, apply heat to the affected area. Use the heat source that your health care provider recommends, such as a moist heat pack or a heating pad. ? Place a towel between your skin and the heat source. ? Leave the heat on for 20-30 minutes. ? Remove the heat if your skin turns bright red. This is especially important if you are unable to feel pain, heat, or cold. You may have a greater risk of getting burned.  Keep lights dim if bright lights bother you or make your headaches worse.      Eating and drinking  Eat meals on a regular schedule.  If you drink alcohol: ? Limit how much you use to:  0-1 drink a day for women.  0-2 drinks a day for men. ? Be aware of how much alcohol is in your drink. In the [place] one drink equals one 12 oz bottle of beer (355 mL), one 5 oz glass of wine (148 mL), or one 1 oz glass of hard liquor (44 mL).  Stop drinking caffeine, or decrease the amount of caffeine you drink. General instructions  Keep a headache journal to help find out what may trigger your headaches. For example, write down: ? What you eat and drink. ? How much sleep  you get. ? Any change to your diet or medicines.  Try massage or other relaxation techniques.  Limit stress.  Sit up straight, and do not tense your muscles.  Do not use any products that contain nicotine or tobacco, such as cigarettes, e-cigarettes, and chewing tobacco. If you need help quitting, ask your health care provider.  Exercise regularly as told by your health care provider.  Sleep on a regular schedule. Get 7-9 hours of sleep each night, or the amount recommended by your health care provider.  Keep all follow-up visits as told by your health care provider. This is important.   Contact a health care provider if:  Your symptoms are not helped by medicine.  You have a headache that is different from the usual headache.  You have nausea or you vomit.  You have a fever. Get help right away if:  Your headache becomes severe quickly.  Your headache gets worse after moderate to intense physical activity.  You have repeated vomiting.  You have a stiff neck.  You have a loss of vision.  You have problems with speech.  You have pain in the eye or ear.  You have muscular weakness or loss of muscle control.  You lose your balance or have trouble walking.  You feel faint or pass out.  You have   confusion.  You have a seizure. Summary  A headache is pain or discomfort felt around the head or neck area.  There are many causes and types of headaches. In some cases, the cause may not be found.  Keep a headache journal to help find out what may trigger your headaches. Watch your condition for any changes. Let your health care provider know about them.  Contact a health care provider if you have a headache that is different from the usual headache, or if your symptoms are not helped by medicine.  Get help right away if your headache becomes severe, you vomit, you have a loss of vision, you lose your balance, or you have a seizure. This information is not intended to  replace advice given to you by your health care provider. Make sure you discuss any questions you have with your health care provider. Document Revised: 08/05/2017 Document Reviewed: 08/05/2017 Elsevier Patient Education  2021 [employer] Inc[employer]     Postpartum Hypertension Postpartum hypertension is high blood pressure that is higher than normal after childbirth. It usually starts within 1 to 2 days after delivery, but it can happen at any time for up to 6 weeks after delivery. For some women, medical treatment is required to prevent serious complications, such as seizures or stroke. What are the causes? The cause of this condition is not well understood. In some cases, the cause may not be known. Certain conditions may increase your risk. These include:  Hypertension that existed before pregnancy (chronic hypertension).  Hypertension that comes as a result of pregnancy (gestational hypertension).  Hypertensive disorders during pregnancy or seizures in women who have high blood pressure during pregnancy. These conditions are called preeclampsia and eclampsia.  A condition in which the liver, platelets, and red blood cells are damaged during pregnancy (HELLP syndrome).  Obesity.  Diabetes. What are the signs or symptoms? As with all types of hypertension, postpartum hypertension may not have any symptoms. Depending on how high your blood pressure is, you may experience:  Headaches. These may be mild, moderate, or severe. They may also be steady, constant, or sudden in onset (thunderclap headache).  Vision changes, such as blurry vision, flashing lights, or seeing spots.  Nausea and vomiting.  Pain in the upper right side of your abdomen.  Shortness of breath.  Difficulty breathing while lying down.  A decrease in the amount of urine that you pass. How is this diagnosed? This condition may be diagnosed based on the results of a physical exam, blood pressure measurements, and blood  and urine tests. You may also have other tests, such as a CT scan or an MRI, to check for other problems of postpartum hypertension. How is this treated? If blood pressure is high enough to require treatment, your options may include:  Medicines to reduce blood pressure (antihypertensives). Tell your health care provider if you are breastfeeding or if you plan to breastfeed. There are many antihypertensive medicines that are safe to take while breastfeeding.  Treating medical conditions that are causing hypertension.  Treating the complications of hypertension, such as seizures, stroke, or kidney problems. Your health care provider will also continue to monitor your blood pressure closely until it is within a safe range for you. Follow these instructions at home: Learn your goal blood pressure Two numbers make up your blood pressure. The first number is called systolic pressure. The second is called diastolic pressure. An example of a blood pressure reading is "120 over 80" (or 120/80).  For most people, goal blood pressure is:  First number: below 140.  Second number: below 90. Your blood pressure is above normal even if only the top or bottom number is above normal. Know what to do before you take your blood pressure 30 minutes before you check your blood pressure:  Do not drink caffeine.  Do not drink alcohol.  Avoid food and drink.  Do not smoke.  Do not exercise. 5 minutes before you check your blood pressure:  Use the bathroom and urinate so that you have an empty bladder.  Sit quietly in a dining room chair. Do not sit in a soft couch or an armchair. Do not talk. Know how to take your blood pressure To check your blood pressure, follow the instructions in the manual that came with your blood pressure monitor. If you have a digital blood pressure monitor, the instructions may be as follows: 1. Sit up straight. 2. Place your feet on the floor. Do not cross your ankles or  legs. 3. Rest your left arm at the level of your heart. You may rest it on a table, desk, or chair. 4. Pull up your shirt sleeve. 5. Wrap the blood pressure cuff around the upper part of your left arm. The cuff should be 1 inch (2.5 cm) above your elbow. It is best to wrap the cuff around bare skin. 6. Fit the cuff snugly around your arm. You should be able to place only one finger between the cuff and your arm. 7. Put the cord inside the groove of your elbow. 8. Press the power button. 9. Sit quietly while the cuff fills with air and loses air. 10. Write down the numbers on the screen. These are your blood pressure readings. 11. Wait 1-2 minutes and then repeat steps 1-10.   Record your blood pressure readings Follow your health care provider's instructions on how to record your blood pressure readings. If you were asked to use this form, follow these instructions:  Get one reading in the morning (a.m.) before you take any medicines.  Get one reading in the evening (p.m.) before supper.  Take at least 2 readings with each blood pressure check. This makes sure the results are correct. Wait 1-2 minutes between measurements.  Write down the results in the spaces on this form. Date: _______________________  a.m. _____________________(1st reading) _____________________(2nd reading)  p.m. _____________________(1st reading) _____________________(2nd reading) Date: _______________________  a.m. _____________________(1st reading) _____________________(2nd reading)  p.m. _____________________(1st reading) _____________________(2nd reading) Date: _______________________  a.m. _____________________(1st reading) _____________________(2nd reading)  p.m. _____________________(1st reading) _____________________(2nd reading) Date: _______________________  a.m. _____________________(1st reading) _____________________(2nd reading)  p.m. _____________________(1st reading)  _____________________(2nd reading) Date: _______________________  a.m. _____________________(1st reading) _____________________(2nd reading)  p.m. _____________________(1st reading) _____________________(2nd reading) General instructions  Take over-the-counter and prescription medicines only as told by your health care provider.  Do not use any products that contain nicotine or tobacco. These products include cigarettes, chewing tobacco, and vaping devices, such as e-cigarettes. If you need help quitting, ask your health care provider.  Check your blood pressure as often as recommended by your health care provider.  Return to your normal activities as told by your health care provider. Ask your health care provider what activities are safe for you.  Keep all follow-up visits. This is important. Contact a health care provider if:  You have new symptoms, such as: ? A headache that does not get better. ? Dizziness. ? Visual changes. ? Nausea and vomiting. Get help right away if:  You develop difficulty breathing.  You have chest pain.  You faint.  You have any symptoms of a stroke. "BE FAST" is an easy way to remember the main warning signs of a stroke: ? B - Balance. Signs are dizziness, sudden trouble walking, or loss of balance. ? E - Eyes. Signs are trouble seeing or a sudden change in vision. ? F - Face. Signs are sudden weakness or numbness of the face, or the face or eyelid drooping on one side. ? A - Arms. Signs are weakness or numbness in an arm. This happens suddenly and usually on one side of the body. ? S - Speech. Signs are sudden trouble speaking, slurred speech, or trouble understanding what people say. ? T - Time. Time to call emergency services. Write down what time symptoms started.  You have other signs of a stroke, such as: ? A sudden, severe headache with no known cause. ? Nausea or vomiting. ? Seizure. These symptoms may represent a serious problem that  is an emergency. Do not wait to see if the symptoms will go away. Get medical help right away. Call your local emergency services (911 in the [place] Do not drive yourself to the hospital. Summary  Postpartum hypertension is high blood pressure that remains higher than normal after childbirth.  For some women, medical treatment is required to prevent serious complications, such as seizures or stroke.  Follow your health care provider's instructions on how to record your blood pressure readings.  Keep all follow-up visits. This is important. This information is not intended to replace advice given to you by your health care provider. Make sure you discuss any questions you have with your health care provider. Document Revised: 10/13/2019 Document Reviewed: 10/13/2019 Elsevier Patient Education  2021 [employer].

## 2020-06-16 NOTE — MAU Note (Signed)
Pp c-section 06/08/20.Pt stated she had had a headache all. Took tylenol without much relief.  Had OB appointment today and b/p was elevated and they started her on Niphedapine today for her b/p but b/p is still elevated (157/87) and still has headache.

## 2020-06-16 NOTE — MAU Provider Note (Signed)
History     CSN: [id number]  Arrival date and time: 06/16/20 2007   Event Date/Time   First Provider Initiated Contact with Patient 06/16/20 2109      Chief Complaint  Patient presents with  . Headache  . Hypertension   HPI Kimberly Kaiser is a 35 y.o. G3P1021 at 8 days postpartum who presents with hypertension & headache. Was induced for gestational hypertension & had a primary c/section due to non reassuring fetal status. BPs were normal at time of discharge & she was not prescribed antihypertensives.  She went to her PCP this morning & was told she had high blood pressure. While there she had a mild headache. Her PCP prescribed her procardia, which she took around 1 pm. She says within 30 minutes her headache became significantly worse. Reports 10/10 headache that is frontal & "splitting". Took 2 extra strength tylenol without relief. Pain aggravated by standing too quickly. No photophobia or phonophobia. Denies visual disturbance, epigastric pain, n/v.   OB History    Gravida  3   Para  1   Term  1   Preterm      AB  2   Living  1     SAB  1   IAB      Ectopic  1   Multiple  0   Live Births  1           Past Medical History:  Diagnosis Date  . Anxiety   . Depression   . PCOS (polycystic ovarian syndrome)   . Pregnancy induced hypertension     Past Surgical History:  Procedure Laterality Date  . CESAREAN SECTION  06/08/2020   Procedure: CESAREAN SECTION;  Surgeon: Charlett Nose, MD;  Location: [facility]S;  Service: Obstetrics;;  . DILATION AND CURETTAGE OF UTERUS    . TONSILLECTOMY AND ADENOIDECTOMY     at age of 61     Family History  Problem Relation Age of Onset  . Hypertension Mother   . Obesity Mother   . Varicose Veins Mother   . Diabetes Father   . Hypertension Father   . Asthma Sister   . Anxiety disorder Sister   . Depression Sister   . Obesity Sister   . Drug abuse Brother   . Cancer Maternal Grandmother   . Diabetes  Paternal Grandmother     Social History   Tobacco Use  . Smoking status: Former Smoker    Packs/day: 0.25    Types: Cigarettes  . Smokeless tobacco: Never Used  Vaping Use  . Vaping Use: Never used  Substance Use Topics  . Alcohol use: Not Currently  . Drug use: Not Currently    Allergies:  Allergies  Allergen Reactions  . Lactose Intolerance (Gi)     No medications prior to admission.    Review of Systems  Constitutional: Negative.   Eyes: Negative for photophobia and visual disturbance.  Respiratory: Negative.   Cardiovascular: Negative.   Gastrointestinal: Negative.   Neurological: Positive for headaches.   Physical Exam   Blood pressure 115/62, pulse 97, temperature 98 F (36.7 C), resp. rate 16, unknown if currently breastfeeding.  Patient Vitals for the past 24 hrs:  BP Temp Pulse Resp  06/16/20 2246 115/62 -- 97 16  06/16/20 2240 (!) 120/53 -- 94 --  06/16/20 2200 131/76 -- (!) 103 --  06/16/20 2155 116/80 -- (!) 101 --  06/16/20 2131 124/82 -- (!) 146 --  06/16/20 2115 132/69 -- Marland Kitchen  106 --  06/16/20 2100 111/71 -- (!) 109 --  06/16/20 2052 98/71 -- (!) 115 --  06/16/20 2041 123/75 98 F (36.7 C) (!) 113 18    Physical Exam Vitals and nursing note reviewed.  Constitutional:      General: She is not in acute distress.    Appearance: She is well-developed.  HENT:     Head: Normocephalic and atraumatic.  Pulmonary:     Effort: Pulmonary effort is normal. No respiratory distress.  Skin:    General: Skin is warm and dry.  Neurological:     Mental Status: She is alert.     Deep Tendon Reflexes: Reflexes normal.  Psychiatric:        Mood and Affect: Mood normal.        Behavior: Behavior normal.     MAU Course  Procedures Results for orders placed or performed during the hospital encounter of 06/16/20 (from the past 24 hour(s))  CBC     Status: Abnormal   Collection Time: 06/16/20  9:21 PM  Result Value Ref Range   WBC 10.7 (H) 4.0 - 10.5  K/uL   RBC 4.01 3.87 - 5.11 MIL/uL   Hemoglobin 10.4 (L) 12.0 - 15.0 g/dL   HCT 87.5 (L) 64.3 - 32.9 %   MCV 84.3 80.0 - 100.0 fL   MCH 25.9 (L) 26.0 - 34.0 pg   MCHC 30.8 30.0 - 36.0 g/dL   RDW 51.8 84.1 - 66.0 %   Platelets 653 (H) 150 - 400 K/uL   nRBC 0.2 0.0 - 0.2 %  Comprehensive metabolic panel     Status: Abnormal   Collection Time: 06/16/20  9:21 PM  Result Value Ref Range   Sodium 136 135 - 145 mmol/L   Potassium 4.2 3.5 - 5.1 mmol/L   Chloride 105 98 - 111 mmol/L   CO2 25 22 - 32 mmol/L   Glucose, Bld 99 70 - 99 mg/dL   BUN 13 6 - 20 mg/dL   Creatinine, Ser 6.30 0.44 - 1.00 mg/dL   Calcium 8.9 8.9 - 16.0 mg/dL   Total Protein 6.2 (L) 6.5 - 8.1 g/dL   Albumin 2.7 (L) 3.5 - 5.0 g/dL   AST 17 15 - 41 U/L   ALT 25 0 - 44 U/L   Alkaline Phosphatase 90 38 - 126 U/L   Total Bilirubin 0.1 (L) 0.3 - 1.2 mg/dL   GFR, Estimated >10 >93 mL/min   Anion gap 6 5 - 15   MDM Postpartum patient with hx of gestational hypertension & elevated blood pressures at home. In MAU she remains normotensive & has normal preeclampsia labs - do not suspect postpartum preeclampsia.   Headache treated with IV headache cocktail (LR bolus, phenergan 25, benadryl 25, reglan 10). Patient reports complete resolution of headache. Based on timing of symptoms, suspect that her headache was a side effect of the procardia she took.  She has an appointment with Dr. Tenny Craw in the morning - will discontinue the procardia until she sees Dr Tenny Craw. Since she's normotensive here & has close follow up, will let her primary ob/gyn determine future antihypertensives.    Assessment and Plan   1. Postpartum headache   -d/c procardia until follow up with Dr. Tenny Craw -encouraged to take blood pressure cuff to appointment to determine accuracy -reviewed reasons to return to MAU  [name] 06/17/2020, 12:55 AM

## 2020-06-29 NOTE — Discharge Summary (Signed)
Postpartum Discharge Summary       Patient Name: Kimberly Kaiser DOB: November 29, 1985 MRN: [id number]  Date of admission: 06/08/2020 Delivery date:06/08/2020  Delivering provider: Derl Barrow E  Date of discharge: 06/29/2020  Admitting diagnosis: Pregnant and not yet delivered [Z34.90] Intrauterine pregnancy: [redacted]w[redacted]d     Secondary diagnosis:  Active Problems:   * No active hospital problems. *  Additional problems: Gestational hypertension, morbid obesity    Discharge diagnosis: Term Pregnancy Delivered and Gestational Hypertension                                              Post partum procedures:NA Augmentation: AROM, Pitocin, Cytotec and OP Foley Complications: None  Hospital course: Induction of Labor With Cesarean Section   35 y.o. yo [id number] at [redacted]w[redacted]d was admitted to the hospital 06/08/2020 for induction of labor. Patient had a labor course significant for NRFWB. The patient went for cesarean section due to Non-Reassuring FHR. Delivery details are as follows: Membrane Rupture Time/Date: 10:09 PM ,06/08/2020   Delivery Method:C-Section, Low Transverse  Details of operation can be found in separate operative Note.  Patient had an uncomplicated postpartum course. She is ambulating, tolerating a regular diet, passing flatus, and urinating well.  Patient is discharged home in stable condition on 06/29/20.      Newborn Data: Birth date:06/08/2020  Birth time:10:10 PM  Gender:Female  Living status:Living  Apgars:3 ,8  Weight:3.291 kg                                 Magnesium Sulfate received: No BMZ received: No Rhophylac:N/A   Physical exam  Vitals:   06/09/20 1430 06/09/20 1745 06/09/20 2120 06/10/20 0608  BP: 101/68  97/68 127/72  Pulse: 100  93 (!) 108  Resp: 18 18 19 18   Temp: 98.9 F (37.2 C)  97.6 F (36.4 C) 98 F (36.7 C)  TempSrc: Oral  Oral Oral  SpO2: 98% 100%  99%  Weight:      Height:       General: alert, cooperative and no distress Lochia:  appropriate Uterine Fundus: firm Incision: Healing well with no significant drainage DVT Evaluation: No evidence of DVT seen on physical exam. Labs: Lab Results  Component Value Date   WBC 10.7 (H) 06/16/2020   HGB 10.4 (L) 06/16/2020   HCT 33.8 (L) 06/16/2020   MCV 84.3 06/16/2020   PLT 653 (H) 06/16/2020   CMP Latest Ref Rng & Units 06/16/2020  Glucose 70 - 99 mg/dL 99  BUN 6 - 20 mg/dL 13  Creatinine 06/18/2020 - 6.38 mg/dL 7.56  Sodium 4.33 - 295 mmol/L 136  Potassium 3.5 - 5.1 mmol/L 4.2  Chloride 98 - 111 mmol/L 105  CO2 22 - 32 mmol/L 25  Calcium 8.9 - 10.3 mg/dL 8.9  Total Protein 6.5 - 8.1 g/dL 6.2(L)  Total Bilirubin 0.3 - 1.2 mg/dL 188)  Alkaline Phos 38 - 126 U/L 90  AST 15 - 41 U/L 17  ALT 0 - 44 U/L 25   Edinburgh Score: Edinburgh Postnatal Depression Scale Screening Tool 06/09/2020  I have been able to laugh and see the funny side of things. 0  I have looked forward with enjoyment to things. 0  I have blamed myself unnecessarily when things went wrong. 1  I have been anxious or worried for no good reason. 2  I have felt scared or panicky for no good reason. 0  Things have been getting on top of me. 1  I have been so unhappy that I have had difficulty sleeping. 0  I have felt sad or miserable. 1  I have been so unhappy that I have been crying. 1  The thought of harming myself has occurred to me. 0  Edinburgh Postnatal Depression Scale Total 6      After visit meds:  Allergies as of 06/10/2020      Reactions   Lactose Intolerance (gi)       Medication List    STOP taking these medications   aspirin 81 MG chewable tablet     TAKE these medications   ibuprofen 600 MG tablet Commonly known as: ADVIL Take 1 tablet (600 mg total) by mouth every 6 (six) hours as needed.   oxyCODONE-acetaminophen 5-325 MG tablet Commonly known as: PERCOCET/ROXICET Take 1 tablet by mouth every 6 (six) hours as needed for severe pain.   prenatal multivitamin Tabs  tablet Take 1 tablet by mouth daily at 12 noon.            Discharge Care Instructions  (From admission, onward)         Start     Ordered   06/10/20 0000  Discharge wound care:       Comments: You may wash incision with soap and water.  Do not soak or submerge the incision for 2 weeks. Keep incision dry. You may need to keep a sanitary pad or panty liner between the incision and your clothing for comfort and to keep the incision dry. If you note drainage, increased pain, or increased redness of the incision, then please notify your physician.   06/10/20 1102           Discharge home in stable condition Infant Feeding: Bottle and Breast Infant Disposition:home with mother Discharge instruction: per After Visit Summary and Postpartum booklet. Activity: Advance as tolerated. Pelvic rest for 6 weeks.  Diet: routine diet Anticipated Birth Control: Unsure Postpartum Appointment:2-3 days Future Appointments:No future appointments. Follow up Visit:  Follow-up Information    Charlett Nose, MD Follow up in 1 week(s).   Specialty: Obstetrics and Gynecology Why: For an incision check Contact information: [address] [place] [address] [phone]                   06/29/2020 Waynard Reeds, MD

## 2021-05-05 ENCOUNTER — Emergency Department (HOSPITAL_BASED_OUTPATIENT_CLINIC_OR_DEPARTMENT_OTHER)
Admission: EM | Admit: 2021-05-05 | Discharge: 2021-05-05 | Disposition: A | Discharge status: Home / Self Care | Payer: Medicaid Other | Attending: Emergency Medicine | Admitting: Emergency Medicine

## 2021-05-05 ENCOUNTER — Encounter (HOSPITAL_BASED_OUTPATIENT_CLINIC_OR_DEPARTMENT_OTHER): Payer: Self-pay

## 2021-05-05 ENCOUNTER — Other Ambulatory Visit: Payer: Self-pay

## 2021-05-05 DIAGNOSIS — R110 Nausea: Secondary | ICD-10-CM | POA: Insufficient documentation

## 2021-05-05 DIAGNOSIS — K0889 Other specified disorders of teeth and supporting structures: Secondary | ICD-10-CM | POA: Diagnosis present

## 2021-05-05 MED ORDER — BUPIVACAINE HCL (PF) 0.5 % IJ SOLN
10.0000 mL | Freq: Once | INTRAMUSCULAR | Status: AC
  Administered 2021-05-05: 10 mL
  Filled 2021-05-05: qty 10

## 2021-05-05 MED ORDER — OXYCODONE-ACETAMINOPHEN 5-325 MG PO TABS: 1.0000 | ORAL_TABLET | Freq: Four times a day (QID) | ORAL | 0 refills | Status: AC | PRN

## 2021-05-05 NOTE — ED Provider Notes (Signed)
?[facility] ?Provider Note ? ? ?CSN: [id number] ?Arrival date & time: 05/05/21  0756 ? ?  ? ?History ? ?Chief Complaint  ?Patient presents with  ? Dental Pain  ? ? ?Kimberly Kaiser is a 36 y.o. female. ? ?Patient is a 36 year old female who presents with dental pain.  Started about 4 days ago.  She has an impacted back molar.  She was initially seen by her PCP and then a dentist.  She was placed on Augmentin and ibuprofen.  She says its not getting any better.  She has an appointment with the oral surgeon but not until June.  She denies any fevers.  She has had some nausea due to the pain.  No facial swelling. ? ? ?  ? ?Home Medications ?Prior to Admission medications   ?Medication Sig Start Date End Date Taking? Authorizing Provider  ?oxyCODONE-acetaminophen (PERCOCET/ROXICET) 5-325 MG tablet Take 1 tablet by mouth every 6 (six) hours as needed for severe pain. 05/05/21  Yes Malvin Johns, MD  ?ferrous gluconate (FERGON) 240 (27 FE) MG tablet  06/16/20   [provider]  ?ibuprofen (ADVIL) 600 MG tablet Take 1 tablet (600 mg total) by mouth every 6 (six) hours as needed. 06/10/20   Vanessa Kick, MD  ?Prenatal Vit-Fe Fumarate-FA (PRENATAL MULTIVITAMIN) TABS tablet Take 1 tablet by mouth daily at 12 noon.    [provider]  ?   ? ?Allergies    ?Lactose intolerance (gi)   ? ?Review of Systems   ?Review of Systems  ?Constitutional:  Negative for fever.  ?HENT:  Positive for dental problem. Negative for facial swelling, trouble swallowing and voice change.   ?Gastrointestinal:  Positive for nausea. Negative for vomiting.  ?Skin:  Negative for wound.  ?Neurological:  Negative for headaches.  ? ?Physical Exam ?Updated Vital Signs ?BP (!) 167/106 (BP Location: Right Arm)   Pulse 92   Temp 98.2 ?F (36.8 ?C) (Oral)   Resp 18   Ht 5\' 5"  (1.651 m)   Wt 133 kg   LMP 04/18/2021 (Approximate)   SpO2 96%   BMI 48.79 kg/m?  ?Physical Exam ?Constitutional:   ?   Appearance: Normal  appearance.  ?HENT:  ?   Head: Normocephalic and atraumatic.  ?   Mouth/Throat:  ?   Comments: Patient has impacted back right molar on the bottom.  There is no swelling around the area.  No evident abscess.  No trismus.  Uvula is midline.  No elevation of tongue ?Cardiovascular:  ?   Rate and Rhythm: Normal rate.  ?Pulmonary:  ?   Effort: Pulmonary effort is normal.  ?Musculoskeletal:  ?   Cervical back: Normal range of motion and neck supple.  ?Neurological:  ?   Mental Status: She is alert and oriented to person, place, and time.  ? ? ?ED Results / Procedures / Treatments   ?Labs ?(all labs ordered are listed, but only abnormal results are displayed) ?Labs Reviewed - No data to display ? ?EKG ?None ? ?Radiology ?No results found. ? ?Procedures ?Dental Block ? ?Date/Time: 05/05/2021 8:40 AM ?Performed by: Malvin Johns, MD ?Authorized by: Malvin Johns, MD  ? ?Consent:  ?  Consent obtained:  Verbal ?  Consent given by:  Patient ?  Risks discussed:  Pain, infection and allergic reaction ?  Alternatives discussed:  No treatment ?Universal protocol:  ?  Patient identity confirmed:  Verbally with patient ?Indications:  ?  Indications: dental pain   ?Location:  ?  Block  type:  Inferior alveolar ?  Laterality:  Right ?Procedure details:  ?  Syringe type:  Controlled syringe ?  Needle gauge:  25 G ?  Anesthetic injected:  Bupivacaine 0.25% w/o epi ?  Injection procedure:  Anatomic landmarks palpated, introduced needle, negative aspiration for blood and incremental injection ?Post-procedure details:  ?  Outcome:  Pain relieved ?  Procedure completion:  Tolerated well, no immediate complications  ? ? ?Medications Ordered in ED ?Medications  ?bupivacaine(PF) (MARCAINE) 0.5 % injection 10 mL (10 mLs Infiltration Given 05/05/21 0823)  ? ? ?ED Course/ Medical Decision Making/ A&P ?  ?                        ?Medical Decision Making ?Risk ?Prescription drug management. ? ? ?Patient presents with dental pain.  She cannot get into  see the oral surgeon until June.  She is continuing her antibiotics.  I did a dental block with good pain relief.  She was given a prescription for a small amount of oxycodone.  Return precautions were given.  She was advised that she will need to follow-up with her primary care doctor regarding her blood pressure. ? ?Final Clinical Impression(s) / ED Diagnoses ?Final diagnoses:  ?Pain, dental  ? ? ?Rx / DC Orders ?ED Discharge Orders   ? ?      Ordered  ?  oxyCODONE-acetaminophen (PERCOCET/ROXICET) 5-325 MG tablet  Every 6 hours PRN       ? 05/05/21 0835  ? ?  ?  ? ?  ? ? ?  ?Malvin Johns, MD ?05/05/21 [phone] ? ?

## 2021-05-05 NOTE — ED Triage Notes (Signed)
Pain to bottom right molar.  Onset 4 days.  States seen by MD and dentist.  On antibiotics.  States pain is worse. ?

## 2021-05-05 NOTE — Discharge Instructions (Addendum)
Follow-up with the oral surgeon as discussed.  Return to emergency room if you have any worsening symptoms.  You need to have follow-up with your primary care doctor to have your blood pressure rechecked. ?

## 2022-04-06 IMAGING — US US MFM OB DETAIL+14 WK
1 series · 13 of 28 positions shown · non-contrast
Comparison: none

[Series 1: us mfm ob detail+14 wk · 13 of 101 slices shown]
[im 4/101]
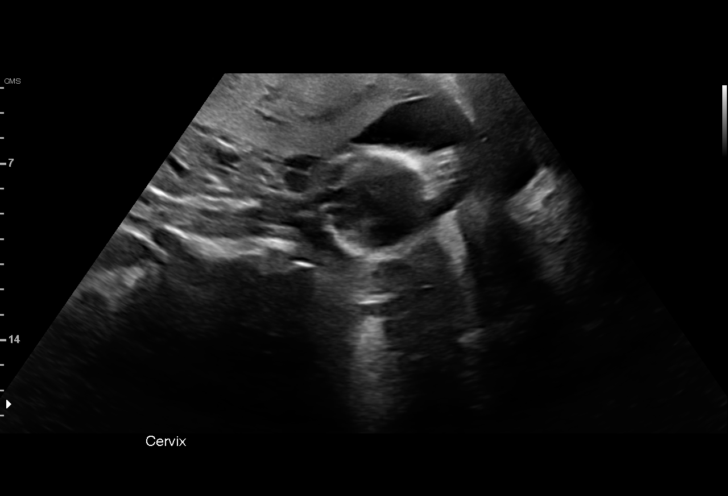
[im 12/101]
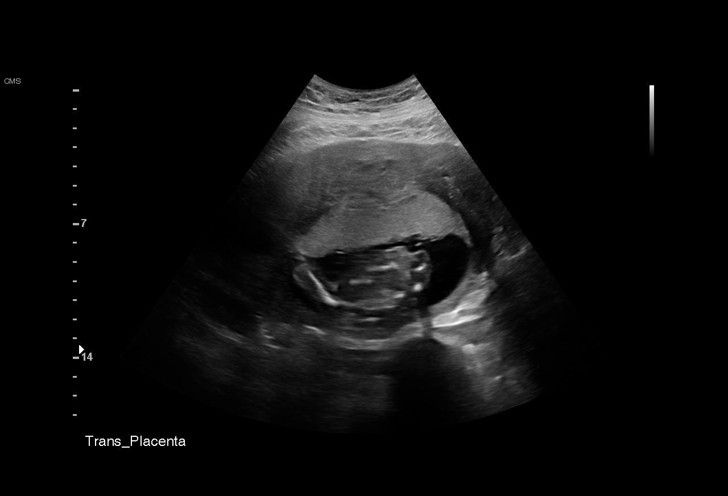
[im 19/101]
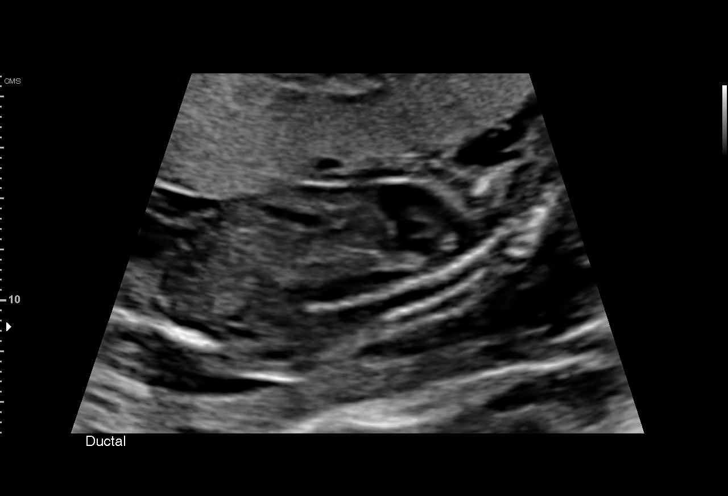
[im 26/101]
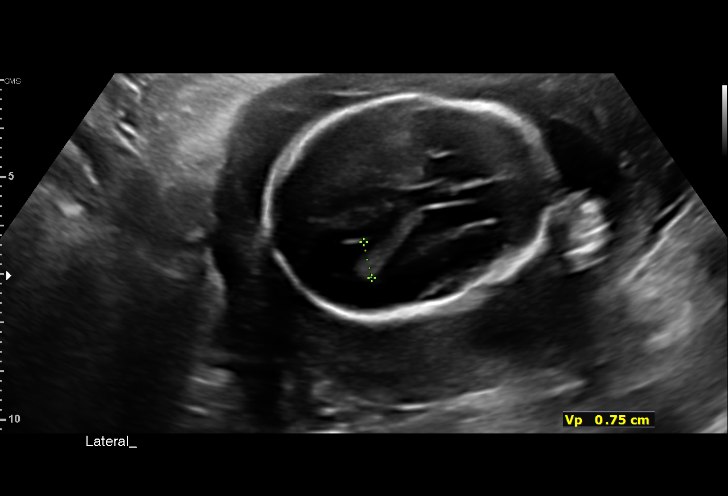
[im 34/101]
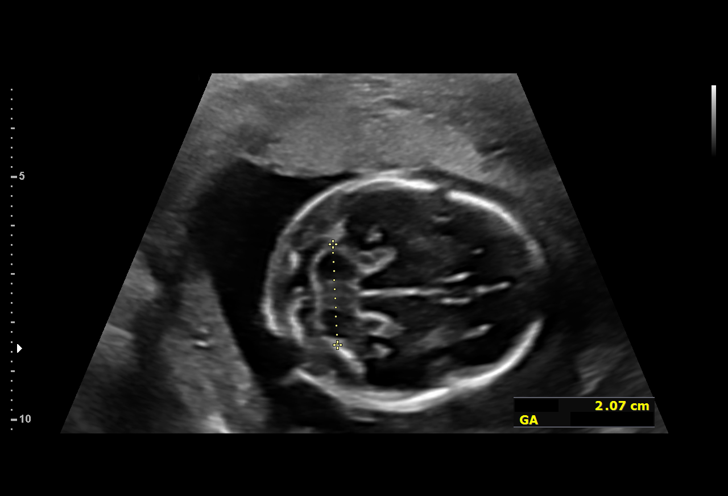
[im 41/101]
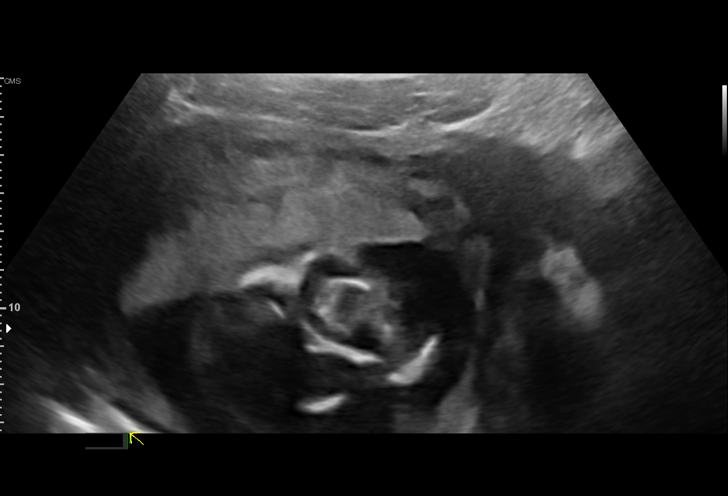
[im 52/101]
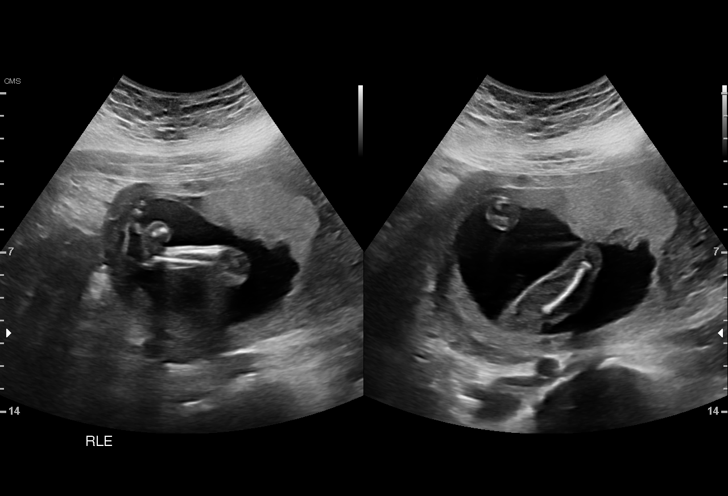
[im 60/101]
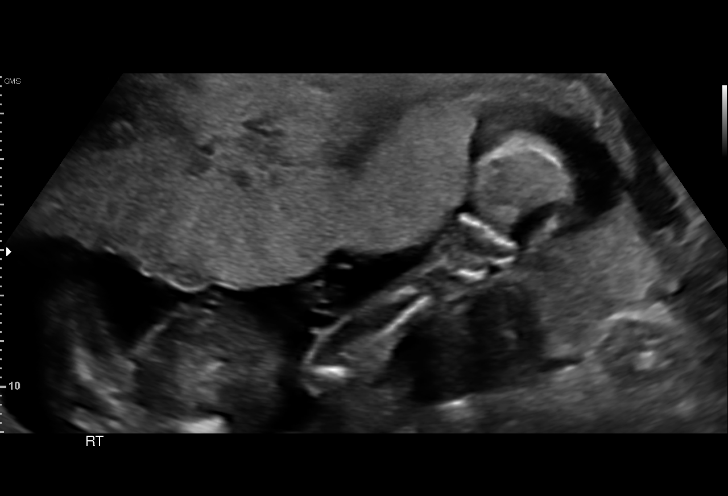
[im 67/101]
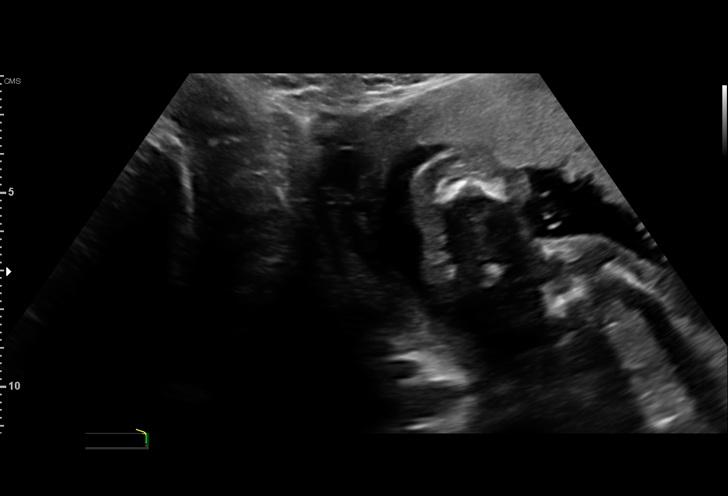
[im 75/101]
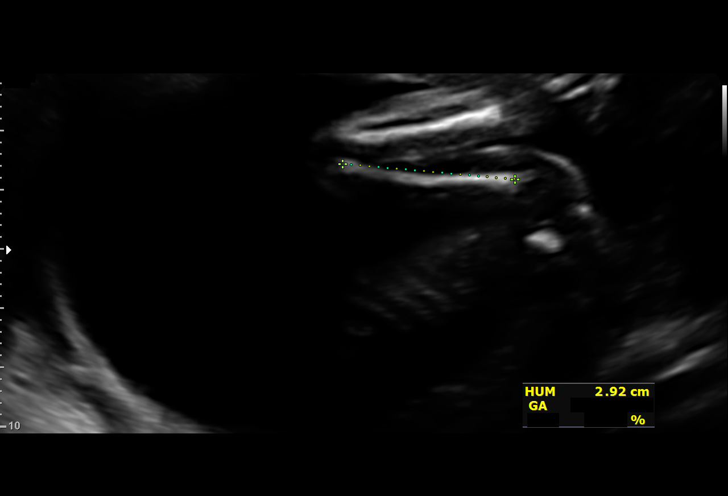
[im 82/101]
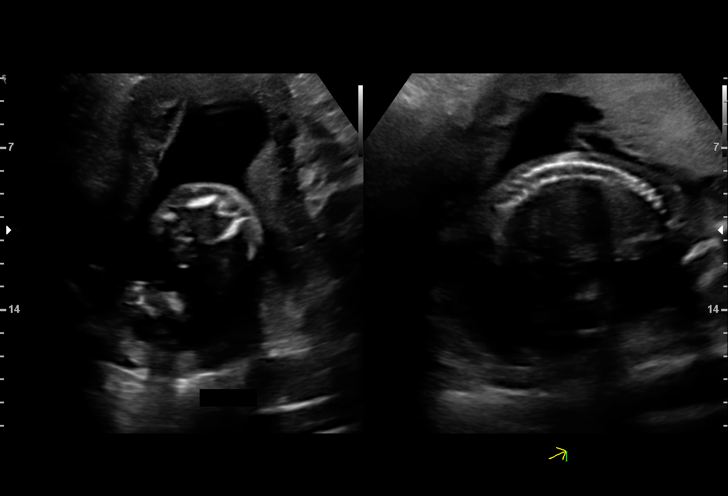
[im 89/101]
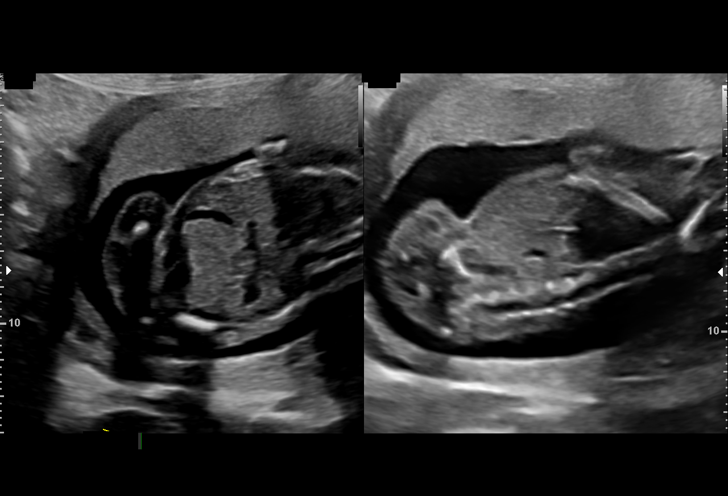
[im 97/101]
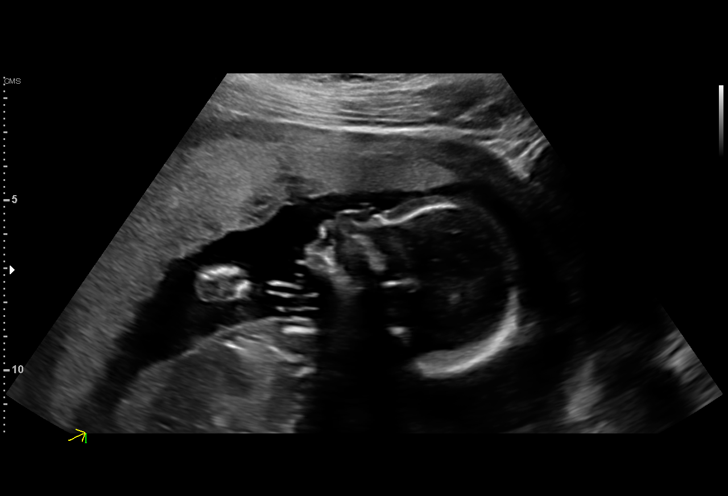

[13 of 28 positions shown; findings below may reference images not displayed]

OBGYN
                   AKELAITIENE

                                                      AKELAITIENE

Indications

 20 weeks gestation of pregnancy
 Antenatal screening for malformations
 Obesity complicating pregnancy, second
 trimester (pregravid BMI 41)
Fetal Evaluation

 Num Of Fetuses:         1
 Fetal Heart Rate(bpm):  153
 Cardiac Activity:       Observed
 Presentation:           Cephalic
 Placenta:               Anterior
 P. Cord Insertion:      Visualized

 Amniotic Fluid
 AFI FV:      Within normal limits

                             Largest Pocket(cm)

Biometry

 BPD:        45  mm     G. Age:  19w 4d         27  %    CI:        68.16   %    70 - 86
                                                         FL/HC:      17.7   %    16.8 -
 HC:      174.3  mm     G. Age:  20w 0d         33  %    HC/AC:      1.22        1.09 -
 AC:       143   mm     G. Age:  19w 5d         28  %    FL/BPD:     68.4   %
 FL:       30.8  mm     G. Age:  19w 4d         22  %    FL/AC:      21.5   %    20 - 24
 HUM:      29.1  mm     G. Age:  19w 3d         35  %
 CER:      19.9  mm     G. Age:  19w 2d         41  %
 NFT:       4.8  mm

 LV:        7.5  mm
 CM:        4.3  mm

 Est. FW:     304  gm    0 lb 11 oz      20  %
OB History

 Gravidity:    3         Term:   0        Prem:   0        SAB:   1
 TOP:          0       Ectopic:  1        Living: 2
Gestational Age

 LMP:           20w 5d        Date:  09/19/19                 EDD:   06/25/20
 U/S Today:     19w 5d                                        EDD:   07/02/20
 Best:          20w 1d     Det. By:  Early Ultrasound         EDD:   06/29/20
                                     (12/03/19)
Anatomy

 Cranium:               Appears normal         LVOT:                   Appears normal
 Cavum:                 Appears normal         Aortic Arch:            Appears normal
 Ventricles:            Appears normal         Ductal Arch:            Appears normal
 Choroid Plexus:        Appears normal         Diaphragm:              Appears normal
 Cerebellum:            Appears normal         Stomach:                Appears normal, left
                                                                       sided
 Posterior Fossa:       Appears normal         Abdomen:                Appears normal
 Nuchal Fold:           Appears normal         Abdominal Wall:         Appears nml (cord
                                                                       insert, abd wall)
 Face:                  Appears normal         Cord Vessels:           Appears normal (3
                        (orbits and profile)                           vessel cord)
 Lips:                  Appears normal         Kidneys:                Appear normal
 Palate:                Appears normal         Bladder:                Appears normal
 Thoracic:              Appears normal         Spine:                  Appears normal
 Heart:                 Appears normal         Upper Extremities:      Appears normal
                        (4CH, axis, and
                        situs)
 RVOT:                  Appears normal         Lower Extremities:      Appears normal

 Other:  Heels/feet and open hands/5th digits visualized. Lenses visualized.
         Nasal bone visualized. VC and 3VV visualized.
Cervix Uterus Adnexa

 Cervix
 Length:           3.45  cm.
 Normal appearance by transabdominal scan.

 Uterus
 No abnormality visualized.

 Right Ovary
 Within normal limits.
 Left Ovary
 Within normal limits.

 Cul De Sac
 No free fluid seen.

 Adnexa
 No abnormality visualized.
Impression

 Single intrauterine pregnancy here for a detailed anatomy
 due to elevated maternal BMI
 Normal anatomy with measurements consistent with dates
 There is good fetal movement and amniotic fluid volume
Recommendations

 Follow up growth in 6 weeks.
 Consider serial growth every 4 weeks with weekly testing
 initiated at 36 weeks.

## 2022-07-27 IMAGING — US US MFM OB FOLLOW-UP
1 series · 13 of 20 positions shown · non-contrast
Comparison: none

[Series 1: us mfm ob follow-up · 20 acquisitions, 13 frames shown]
[im 1/20]
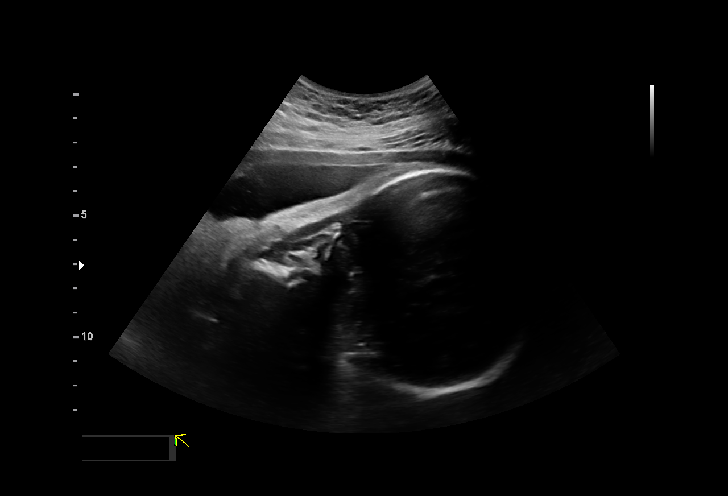
[im 3/20]
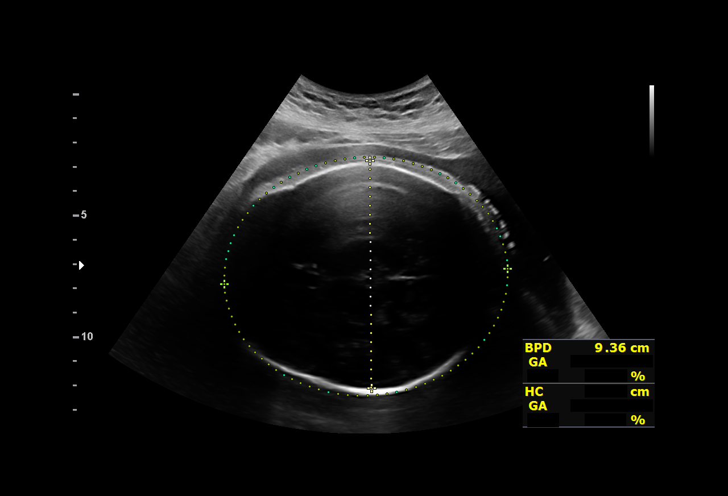
[im 4/20]
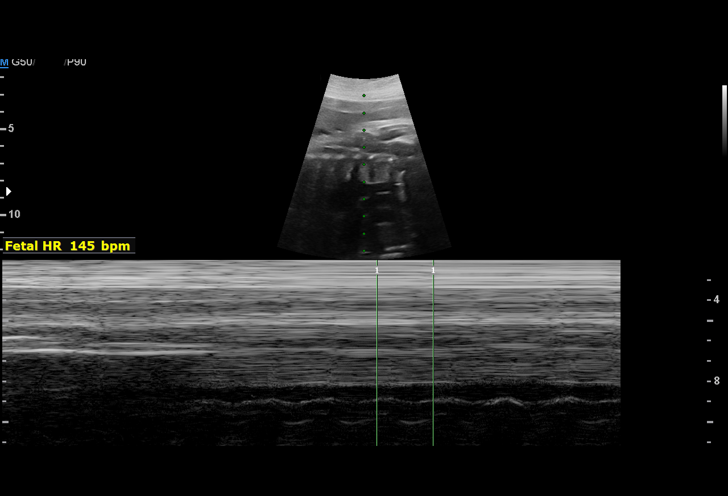
[im 6/20]
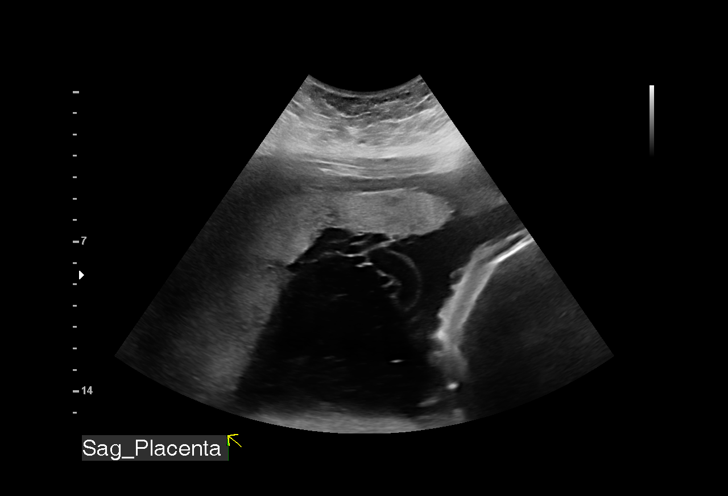
[im 7/20]
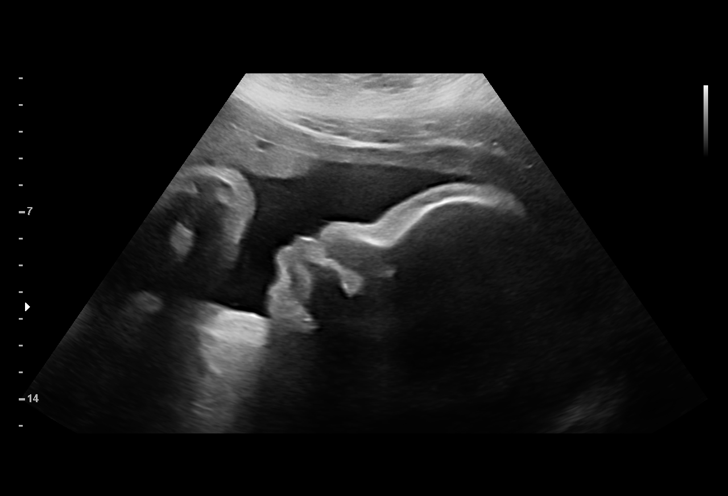
[im 9/20]
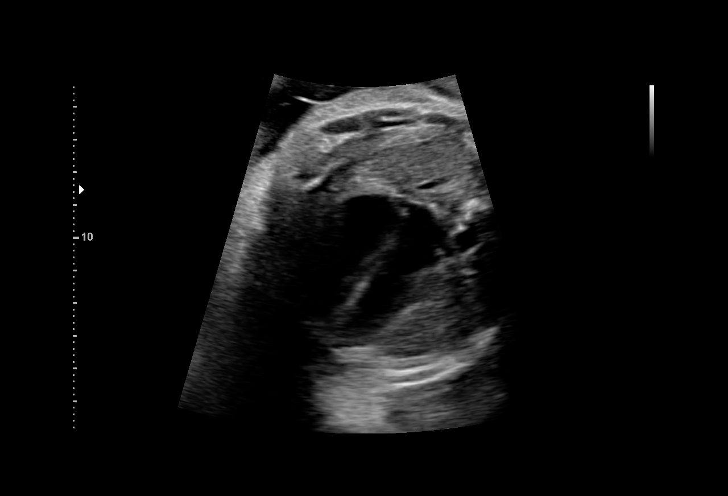
[im 11/20]
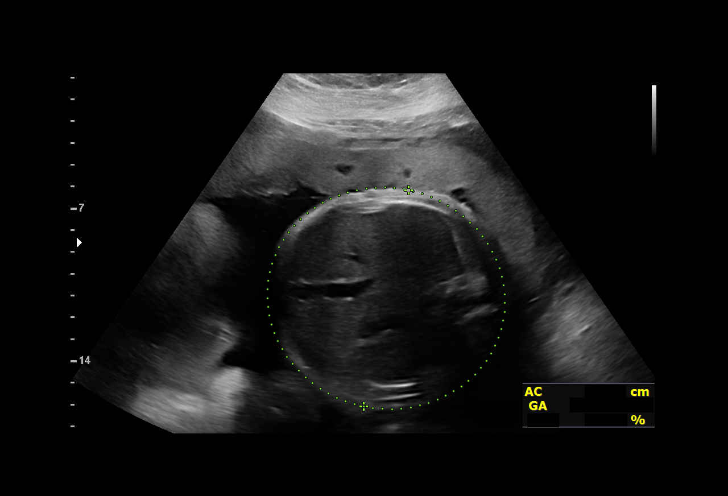
[im 12/20]
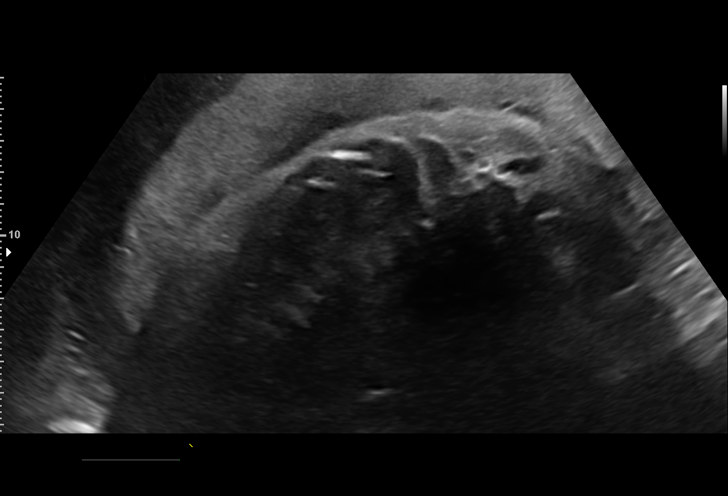
[im 14/20]
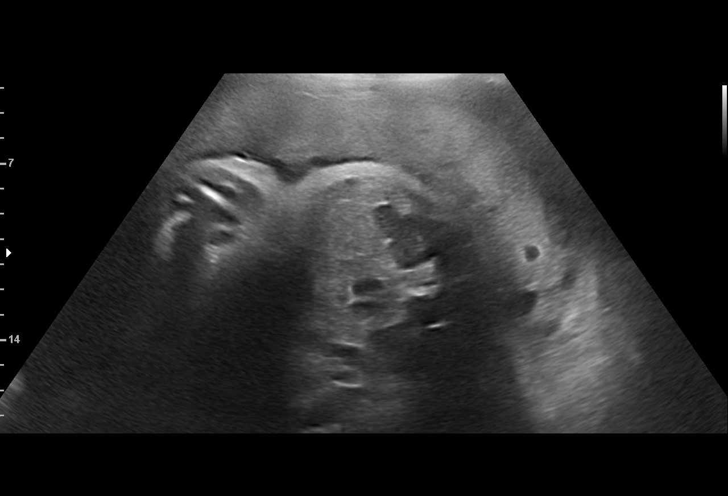
[im 15/20]
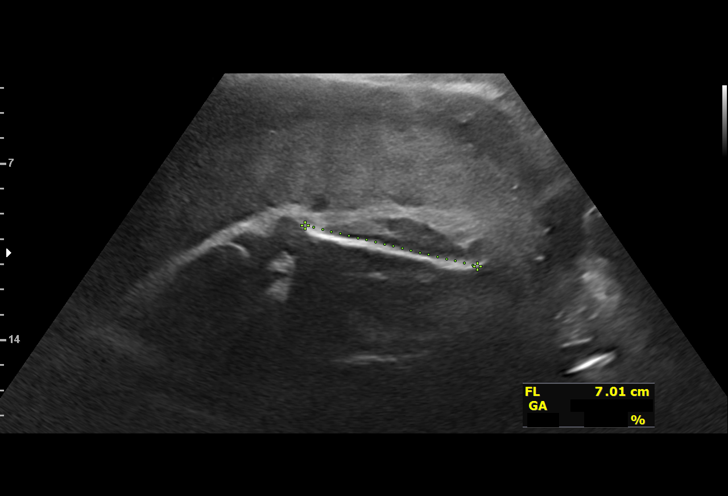
[im 17/20]
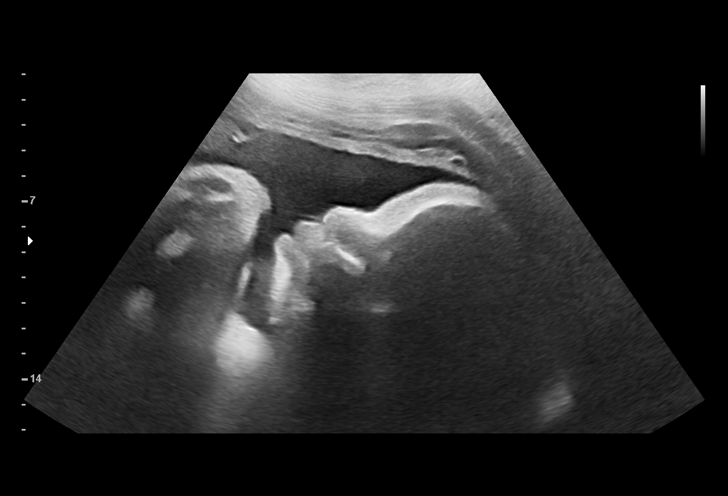
[im 18/20]
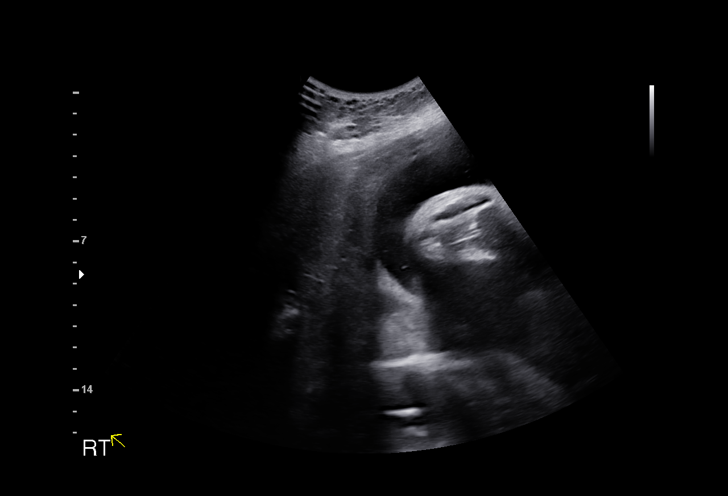
[im 20/20]
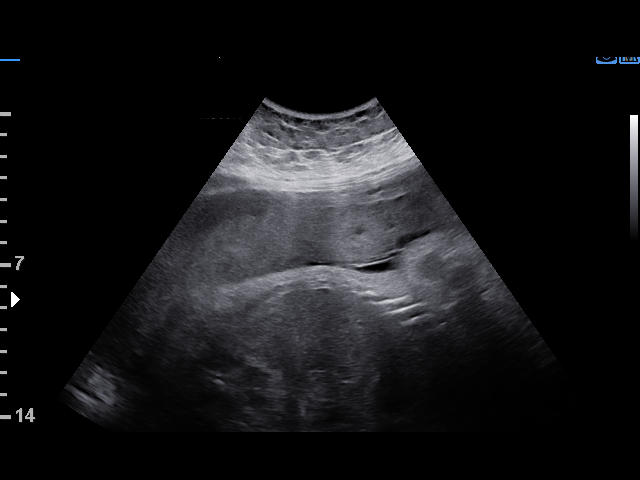

[13 of 20 positions shown; findings below may reference images not displayed]

OBGYN
                   DARWIN

Indications

 36 weeks gestation of pregnancy
 Obesity complicating pregnancy, second
 trimester (pregravid BMI 41)
 Encounter for other antenatal screening
 follow-up
 Tobacco use complicating pregnancy, third
 trimester
Fetal Evaluation

 Num Of Fetuses:         1
 Fetal Heart Rate(bpm):  145
 Cardiac Activity:       Observed
 Presentation:           Cephalic
 Placenta:               Anterior
 P. Cord Insertion:      Visualized, central

 Amniotic Fluid
 AFI FV:      Within normal limits

 AFI Sum(cm)     %Tile       Largest Pocket(cm)
 22.09           84

 RUQ(cm)       RLQ(cm)       LUQ(cm)        LLQ(cm)

Biophysical Evaluation

 Amniotic F.V:   Within normal limits       F. Tone:        Observed
 F. Movement:    Observed                   Score:          [DATE]
 F. Breathing:   Observed
Biometry

 BPD:      93.6  mm     G. Age:  38w 1d         95  %    CI:           76   %    70 - 86
                                                         FL/HC:      20.5   %    20.1 -
 HC:      340.3  mm     G. Age:  39w 1d         88  %    HC/AC:      1.02        0.93 -
 AC:      332.3  mm     G. Age:  37w 1d         85  %    FL/BPD:     74.6   %    71 - 87
 FL:       69.8  mm     G. Age:  35w 6d         37  %    FL/AC:      21.0   %    20 - 24

 Est. FW:    1072  gm    6 lb 14 oz      78  %
OB History

 Gravidity:    3         Term:   0        Prem:   0        SAB:   1
 TOP:          0       Ectopic:  1        Living: 2
Gestational Age

 LMP:           36w 5d        Date:  09/19/19                 EDD:   06/25/20
 U/S Today:     37w 4d                                        EDD:   06/19/20
 Best:          36w 1d     Det. By:  Early Ultrasound         EDD:   06/29/20
                                     (12/03/19)
Anatomy

 Cranium:               Previously seen        Aortic Arch:            Previously seen
 Cavum:                 Previously seen        Ductal Arch:            Previously seen
 Ventricles:            Appears normal         Diaphragm:              Previously seen
 Choroid Plexus:        Previously seen        Stomach:                Appears normal, left
                                                                       sided
 Cerebellum:            Previously seen        Abdomen:                Previously seen
 Posterior Fossa:       Previously seen        Abdominal Wall:         Previously seen
 Nuchal Fold:           Previously seen        Cord Vessels:           Previously seen
 Face:                  Appears normal         Kidneys:                Appear normal
                        (orbits and profile)
 Lips:                  Previously seen        Bladder:                Appears normal
 Thoracic:              Previously seen        Spine:                  Previously seen
 Heart:                 Appears normal         Upper Extremities:      Previously seen
                        (4CH, axis, and
                        situs)
 RVOT:                  Previously seen        Lower Extremities:      Previously seen
 LVOT:                  Previously seen

 Other:  VC, 3VV and 3VTV previously visualized. Male gender previously
         seen.
Cervix Uterus Adnexa

 Cervix
 Not visualized (advanced GA >80wks)
 Right Ovary
 Not visualized.

 Left Ovary
 Not visualized.
Impression

 Patient returned for fetal growth assessment and antenatal
 testing. Her blood pressures were 145/89- and 145/75-mm
 Hg. She has intermittent headaches that resolved now.
 prenatal visits except once at 28 weeks' gestation.
 Fetal growth is appropriate for gestational age. Amniotic fluid
 is normal and good fetal activity is seen. Antenatal testing is
 reassuring. BPP [DATE]. Cephalic presentation.
 I reassured the patient of the findings. I discussed the
 possibility of gestational hypertension/preeclampsia.
 If this is a new onset hypertension in pregnancy, I
 recommend delivery at 37 weeks' gestation. However, I will
 defer the decision to her obstetric providers (review of her
 prenatal visits).
Recommendations

 -Patient can follow-up at your office for weekly antenatal
 testing (if undelivered).
                 Jim, Lorraine
